# Patient Record
Sex: Male | Born: 1950 | Race: White | Hispanic: No | Marital: Married | Smoking: Never smoker
Health system: Southern US, Community
[De-identification: ages and names within clinical notes are randomized; demographics above are authoritative.]

## PROBLEM LIST (undated history)

## (undated) DIAGNOSIS — I10 Essential (primary) hypertension: Secondary | ICD-10-CM

## (undated) DIAGNOSIS — E119 Type 2 diabetes mellitus without complications: Secondary | ICD-10-CM

## (undated) DIAGNOSIS — H9120 Sudden idiopathic hearing loss, unspecified ear: Secondary | ICD-10-CM

## (undated) DIAGNOSIS — Z952 Presence of prosthetic heart valve: Secondary | ICD-10-CM

## (undated) DIAGNOSIS — G20 Parkinson's disease: Secondary | ICD-10-CM

## (undated) DIAGNOSIS — G20A1 Parkinson's disease without dyskinesia, without mention of fluctuations: Secondary | ICD-10-CM

## (undated) DIAGNOSIS — D329 Benign neoplasm of meninges, unspecified: Secondary | ICD-10-CM

## (undated) HISTORY — DX: Presence of prosthetic heart valve: Z95.2

## (undated) HISTORY — DX: Type 2 diabetes mellitus without complications: E11.9

## (undated) HISTORY — PX: CATARACT EXTRACTION, BILATERAL: SHX1313

## (undated) HISTORY — PX: BACK SURGERY: SHX140

## (undated) HISTORY — PX: CARDIAC SURGERY: SHX584

## (undated) HISTORY — DX: Essential (primary) hypertension: I10

## (undated) HISTORY — PX: HEMORROIDECTOMY: SUR656

## (undated) HISTORY — PX: KNEE SURGERY: SHX244

---

## 1995-12-14 DIAGNOSIS — E119 Type 2 diabetes mellitus without complications: Secondary | ICD-10-CM

## 1995-12-14 HISTORY — DX: Type 2 diabetes mellitus without complications: E11.9

## 2016-04-20 ENCOUNTER — Encounter: Payer: Self-pay | Admitting: Endocrinology

## 2016-04-20 ENCOUNTER — Ambulatory Visit (INDEPENDENT_AMBULATORY_CARE_PROVIDER_SITE_OTHER): Payer: BLUE CROSS/BLUE SHIELD | Admitting: Endocrinology

## 2016-04-20 VITALS — BP 136/70 | HR 62 | Temp 97.9°F | Ht 70.0 in | Wt 235.0 lb

## 2016-04-20 DIAGNOSIS — E1159 Type 2 diabetes mellitus with other circulatory complications: Secondary | ICD-10-CM | POA: Diagnosis not present

## 2016-04-20 DIAGNOSIS — Z794 Long term (current) use of insulin: Secondary | ICD-10-CM

## 2016-04-20 DIAGNOSIS — I10 Essential (primary) hypertension: Secondary | ICD-10-CM

## 2016-04-20 DIAGNOSIS — Z954 Presence of other heart-valve replacement: Secondary | ICD-10-CM | POA: Diagnosis not present

## 2016-04-20 DIAGNOSIS — E119 Type 2 diabetes mellitus without complications: Secondary | ICD-10-CM | POA: Insufficient documentation

## 2016-04-20 DIAGNOSIS — Z952 Presence of prosthetic heart valve: Secondary | ICD-10-CM | POA: Insufficient documentation

## 2016-04-20 NOTE — Patient Instructions (Addendum)
good diet and exercise significantly improve the control of your diabetes.  please let me know if you wish to be referred to a dietician.  high blood sugar is very risky to your health.  you should see an eye doctor and dentist every year.  It is very important to get all recommended vaccinations.  controlling your blood pressure and cholesterol drastically reduces the damage diabetes does to your body.  Those who smoke should quit.  please discuss these with your doctor.  check your blood sugar twice a day.  vary the time of day when you check, between before the 3 meals, and at bedtime.  also check if you have symptoms of your blood sugar being too high or too low.  please keep a record of the readings and bring it to your next appointment here (or you can bring the meter itself).  You can write it on any piece of paper.  please call us sooner if your blood sugar goes below 70, or if you have a lot of readings over 200.  Please take these pump settings:  basal rate of 1.5 units/hr, 24 hrs per day.   mealtime bolus of 6 units with each meal.   correction bolus (which some people call "sensitivity," or "insulin sensitivity ratio," or just "isr") of 1 unit for each 25 by which your glucose exceeds 100.  At our office, we are fortunate to have two specialists who are happy to help you:  Leonia Reader, RN, CDE, is a diabetes educator and pump trainer.  She is here on Monday mornings, and all day Tuesday and Wednesday.  She is can help you with low blood sugar avoidance and treatment, injecting insulin, sick day management, and others.   Antonieta Iba, RD is our dietician.  She is here all day Thursday and Friday.  She can advise you about a healthy diet.  She can also help you about a variety of special diabetes situations, such as shift work, Actor, gluten-free, diet for kidney patients, traveling with diabetes, and help for those who need to gain weight.   Please come back for a follow-up  appointment in 2-3 weeks.  Please see [name] the same day, to consider continuous glucose monitor.

## 2016-04-20 NOTE — Progress Notes (Signed)
Subjective:    Patient ID: Bradley Berger, male    DOB: May 10, 1951, 65 y.o.   MRN: [id number]  HPI pt states DM was dx'ed in 1997; he has moderate neuropathy of the lower extremities, and assoc pain; he also has CAD; he has been on insulin since 1999, and pump rx since 2000; he now uses a medtronic paradigm (current pump is between 80 and 57 years old; his diet and exercise are poor; he has never had pancreatitis, severe hypoglycemia or DKA.  continue basal rate of 2 units/hr, except for units/hr, 24 hrs per day. continue mealtime bolus of 8 units with each meal (but he only remembers to take only once per day) continue correction bolus (which some people call "sensitivity," or "insulin sensitivity ratio," or just "isr") of 1 unit for each 25 by which glucose exceeds 100.  He says cbg's vary from 60-200.  It is in general higher as the day goes on.   Past Medical History  Diagnosis Date  . S/P aortic valve replacement   . Diabetes ([place])     No past surgical history on file.  Social History   Social History  . Marital Status: Unknown    Spouse Name: N/A  . Number of Children: N/A  . Years of Education: N/A   Occupational History  . Not on file.   Social History Main Topics  . Smoking status: Never Smoker   . Smokeless tobacco: Not on file  . Alcohol Use: 0.0 oz/week    0 Standard drinks or equivalent per week  . Drug Use: Not on file  . Sexual Activity: Not on file   Other Topics Concern  . Not on file   Social History Narrative  . No narrative on file    No current outpatient prescriptions on file prior to visit.   No current facility-administered medications on file prior to visit.    Allergies  Allergen Reactions  . Hydromorphone Hcl Other (See Comments)    "flushing"  . Morphine Other (See Comments)    "flushing"    Family History  Problem Relation Age of Onset  . Diabetes Paternal Grandfather     BP 136/70 mmHg  Pulse 62  Temp(Src) 97.9 F (36.6 C)  (Oral)  Ht 5\' 10"  (1.778 m)  Wt 235 lb (106.595 kg)  BMI 33.72 kg/m2  SpO2 95%   Review of Systems denies blurry vision, headache, chest pain, sob, n/v, urinary frequency, excessive diaphoresis, depression, cold intolerance, and rhinorrhea.  He has chronic numbness and pain of the feet.  He has arthralgias, weight gain, easy bruising, and fatigue.      Objective:   Physical Exam VS: see vs page GEN: no distress HEAD: head: no deformity eyes: no periorbital swelling, no proptosis external nose and ears are normal mouth: no lesion seen NECK: supple, thyroid is not enlarged CHEST WALL: no deformity.  Old healed surgical scar (median sternotomy).  LUNGS: clear to auscultation CV: reg rate and rhythm, no murmur, but valve click is heard.  ABD: abdomen is soft, nontender.  no hepatosplenomegaly.  not distended.  no hernia MUSCULOSKELETAL: muscle bulk and strength are grossly normal.  no obvious joint swelling.  gait is normal and steady EXTEMITIES: no deformity.  no ulcer on the feet.  feet are of normal color and temp.  1+ bilat leg edema, and bilat varicosities.  There is ecchymosis under the right great toenail.  PULSES: dorsalis pedis intact bilat.  no carotid bruit. NEURO:  cn 2-12 grossly intact.   readily moves all 4's.  sensation is intact to touch on the feet, but decreased from normal SKIN:  Normal texture and temperature.  No rash or suspicious lesion is visible.   NODES:  None palpable at the neck PSYCH: alert, well-oriented.  Does not appear anxious nor depressed.    A1c=6.5%  I have reviewed outside records, and summarized: Pt was noted to have variable cbg's, and referred here.      Assessment & Plan:  Type 2 DM, on pump rx: The pattern of his cbg's indicates he needs some adjustment in his pump settings. For upcoming colonoscopy: The day before, night before, and day of colonoscopy, take a basal rate of just 1 unit/hr Do not take mealtime boluses Take usual  "correction" boluses. When you are eating after the procedure, resume usual settings   Patient is advised the following: Patient Instructions  good diet and exercise significantly improve the control of your diabetes.  please let me know if you wish to be referred to a dietician.  high blood sugar is very risky to your health.  you should see an eye doctor and dentist every year.  It is very important to get all recommended vaccinations.  controlling your blood pressure and cholesterol drastically reduces the damage diabetes does to your body.  Those who smoke should quit.  please discuss these with your doctor.  check your blood sugar twice a day.  vary the time of day when you check, between before the 3 meals, and at bedtime.  also check if you have symptoms of your blood sugar being too high or too low.  please keep a record of the readings and bring it to your next appointment here (or you can bring the meter itself).  You can write it on any piece of paper.  please call us sooner if your blood sugar goes below 70, or if you have a lot of readings over 200.  Please take these pump settings:  basal rate of 1.5 units/hr, 24 hrs per day.   mealtime bolus of 6 units with each meal.   correction bolus (which some people call "sensitivity," or "insulin sensitivity ratio," or just "isr") of 1 unit for each 25 by which your glucose exceeds 100.  At our office, we are fortunate to have two specialists who are happy to help you:  Leonia Reader, RN, CDE, is a diabetes educator and pump trainer.  She is here on Monday mornings, and all day Tuesday and Wednesday.  She is can help you with low blood sugar avoidance and treatment, injecting insulin, sick day management, and others.   Antonieta Iba, RD is our dietician.  She is here all day Thursday and Friday.  She can advise you about a healthy diet.  She can also help you about a variety of special diabetes situations, such as shift work, Actor,  gluten-free, diet for kidney patients, traveling with diabetes, and help for those who need to gain weight.   Please come back for a follow-up appointment in 2-3 weeks.  Please see [name] the same day, to consider continuous glucose monitor.

## 2016-04-22 ENCOUNTER — Telehealth: Payer: Self-pay | Admitting: Endocrinology

## 2016-04-22 ENCOUNTER — Encounter: Payer: Self-pay | Admitting: Endocrinology

## 2016-04-22 DIAGNOSIS — I10 Essential (primary) hypertension: Secondary | ICD-10-CM | POA: Insufficient documentation

## 2016-04-22 NOTE — Telephone Encounter (Signed)
please call patient: For upcoming colonoscopy: The day before, night before, and day of colonoscopy, take a basal rate of just 1 unit/hr Do not take mealtime boluses Take usual "correction" boluses. When you are eating after the procedure, resume usual settings.

## 2016-04-23 NOTE — Telephone Encounter (Signed)
I contacted the pt and advised of instructions below. Pt voiced understanding.

## 2016-05-04 ENCOUNTER — Encounter: Payer: Self-pay | Admitting: Endocrinology

## 2016-05-04 ENCOUNTER — Ambulatory Visit (INDEPENDENT_AMBULATORY_CARE_PROVIDER_SITE_OTHER): Payer: BLUE CROSS/BLUE SHIELD | Admitting: Endocrinology

## 2016-05-04 ENCOUNTER — Encounter: Payer: BLUE CROSS/BLUE SHIELD | Attending: Endocrinology | Admitting: Nutrition

## 2016-05-04 VITALS — BP 122/60 | HR 55 | Temp 98.0°F | Ht 70.0 in | Wt 240.0 lb

## 2016-05-04 DIAGNOSIS — Z794 Long term (current) use of insulin: Secondary | ICD-10-CM | POA: Diagnosis not present

## 2016-05-04 DIAGNOSIS — E1159 Type 2 diabetes mellitus with other circulatory complications: Secondary | ICD-10-CM

## 2016-05-04 NOTE — Patient Instructions (Addendum)
check your blood sugar twice a day.  vary the time of day when you check, between before the 3 meals, and at bedtime.  also check if you have symptoms of your blood sugar being too high or too low.  please keep a record of the readings and bring it to your next appointment here (or you can bring the meter itself).  You can write it on any piece of paper.  please call us sooner if your blood sugar goes below 70, or if you have a lot of readings over 200.  Please continue these pump settings for now:  basal rate of 1.5 units/hr, 24 hrs per day.   mealtime bolus of 6 units with each meal.   correction bolus (which some people call "sensitivity," or "insulin sensitivity ratio," or just "isr") of 1 unit for each 25 by which your glucose exceeds 100.  blood tests are requested for you today.  We'll let you know about the results. Please come back for a follow-up appointment in 3 months.  Please see [name] the same day, to consider continuous glucose monitor.

## 2016-05-04 NOTE — Progress Notes (Signed)
Subjective:    Patient ID: Bradley Berger, male    DOB: [birth year], 65 y.o.   MRN: [id number]  HPI  Pt returns for f/u of diabetes mellitus: DM type: Insulin-requiring type 2 Dx'ed: 0000000 Complications: polyneuropathy and CAD Therapy: insulin since 1999 DKA: never Severe hypoglycemia: never Pancreatitis: never Other: he uses a medtronic paradigm pump Interval history:  He takes these pump settings: basal rate of 1.5 units/hr, 24 hrs per day.   mealtime bolus of 6 units with each meal.   correction bolus of 1 unit for each 25 by which your glucose exceeds 100.  he brings a record of his cbg's which i have reviewed today.  It varies from 70-181.  It is in general higher as the day goes on.  pt states he feels well in general.  Past Medical History  Diagnosis Date  . S/P aortic valve replacement   . Diabetes ([place])   . HTN (hypertension)     No past surgical history on file.  Social History   Social History  . Marital Status: Unknown    Spouse Name: N/A  . Number of Children: N/A  . Years of Education: N/A   Occupational History  . Not on file.   Social History Main Topics  . Smoking status: Never Smoker   . Smokeless tobacco: Not on file  . Alcohol Use: 0.0 oz/week    0 Standard drinks or equivalent per week  . Drug Use: Not on file  . Sexual Activity: Not on file   Other Topics Concern  . Not on file   Social History Narrative    Current Outpatient Prescriptions on File Prior to Visit  Medication Sig Dispense Refill  . APIDRA 100 UNIT/ML injection     . aspirin EC 81 MG tablet Take by mouth.    . calcium-vitamin D (OSCAL WITH D) 500-200 MG-UNIT tablet Take by mouth.    . docusate sodium (COLACE) 100 MG capsule Take by mouth.    . gabapentin (NEURONTIN) 600 MG tablet Take by mouth.    Marland Kitchen imipramine (TOFRANIL) 10 MG tablet Take 10 mg by mouth at bedtime.    Marland Kitchen lisinopril (PRINIVIL,ZESTRIL) 2.5 MG tablet Take by mouth.    . loratadine (CLARITIN) 10 MG tablet  Take by mouth.    . metoprolol tartrate (LOPRESSOR) 25 MG tablet Take by mouth.    . ranitidine (ZANTAC) 150 MG tablet Take by mouth.    . Vitamins/Minerals TABS Take by mouth.    . warfarin (COUMADIN) 4 MG tablet Take by mouth.     No current facility-administered medications on file prior to visit.    Allergies  Allergen Reactions  . Hydromorphone Hcl Other (See Comments)    "flushing"  . Morphine Other (See Comments)    "flushing"    Family History  Problem Relation Age of Onset  . Diabetes Paternal Grandfather     BP 122/60 mmHg  Pulse 55  Temp(Src) 98 F (36.7 C) (Oral)  Ht 5\' 10"  (1.778 m)  Wt 240 lb (108.863 kg)  BMI 34.44 kg/m2  SpO2 95%  Review of Systems He denies hypoglycemia    Objective:   Physical Exam VITAL SIGNS:  See vs page GENERAL: no distress Pulses: dorsalis pedis intact bilat.   MSK: no deformity of the feet CV: no leg edema Skin:  no ulcer on the feet.  normal color and temp on the feet. Neuro: sensation is intact to touch on the feet  Fructosamine=269  Assessment & Plan:  DM: still overcontrolled.  Patient is advised the following: Patient Instructions  check your blood sugar twice a day.  vary the time of day when you check, between before the 3 meals, and at bedtime.  also check if you have symptoms of your blood sugar being too high or too low.  please keep a record of the readings and bring it to your next appointment here (or you can bring the meter itself).  You can write it on any piece of paper.  please call us sooner if your blood sugar goes below 70, or if you have a lot of readings over 200.  Please continue these pump settings for now:  basal rate of 1.5 units/hr, 24 hrs per day.   mealtime bolus of 6 units with each meal.   correction bolus (which some people call "sensitivity," or "insulin sensitivity ratio," or just "isr") of 1 unit for each 25 by which your glucose exceeds 100.  blood tests are requested for you today.   We'll let you know about the results. Please come back for a follow-up appointment in 3 months.  Please see [name] the same day, to consider continuous glucose monitor.    addendum: Please reduce the basal rate to 1.4 units/hr, 24 hrs per day.

## 2016-05-05 LAB — FRUCTOSAMINE: Fructosamine: 269 umol/L (ref 0–285)

## 2016-05-05 NOTE — Patient Instructions (Signed)
Call me when the pump ships, to set up an appt.for training.

## 2016-05-05 NOTE — Progress Notes (Signed)
Discussed CGM with patient, and the new insulin pump.  His Medtronic pump is past warrenty.  Showed him then new pump and discussed the advantages of being on the CGM.  He is not medicare and suggested he consider to get this before his wife retires in 1 year.  He filled out paperwork and we faxed in the paperwork to [place].   They had no final questions. He will call me when his pump is shipped to set up an appt.

## 2016-05-06 ENCOUNTER — Telehealth: Payer: Self-pay | Admitting: Endocrinology

## 2016-05-06 NOTE — Telephone Encounter (Signed)
I contacted the Bradley Berger and advised Per Dr. Loanne Drilling to reduce his basal rate to 1.4 units per day. Bradley Berger voiced understanding.

## 2016-05-06 NOTE — Telephone Encounter (Signed)
Patient is returning your call.  

## 2016-05-27 ENCOUNTER — Encounter: Payer: Self-pay | Admitting: Dietician

## 2016-05-27 ENCOUNTER — Encounter: Payer: BLUE CROSS/BLUE SHIELD | Attending: Endocrinology | Admitting: Dietician

## 2016-05-27 VITALS — Ht 70.0 in | Wt 238.0 lb

## 2016-05-27 DIAGNOSIS — Z794 Long term (current) use of insulin: Secondary | ICD-10-CM | POA: Insufficient documentation

## 2016-05-27 DIAGNOSIS — E1159 Type 2 diabetes mellitus with other circulatory complications: Secondary | ICD-10-CM | POA: Insufficient documentation

## 2016-05-27 DIAGNOSIS — E1359 Other specified diabetes mellitus with other circulatory complications: Secondary | ICD-10-CM

## 2016-05-27 NOTE — Patient Instructions (Addendum)
Bring the other meter downstairs.   Remember to check your blood sugar before meals and take the meal time insulin dose. Remember the "rule of 15" to treat low blood sugar. Consider getting glucose tabs. Choose an unsweetened cereal.  Add protein with each meal and snack. Stay as active as possible. Know that when you start to decrease your carbohydrate intake you may need to decrease your insulin.  Speak with Dr. Loanne Drilling or [name] about this. Be mindful about your carbohydrate intake and balance throughout the day.  Aim for 3-4 Carb Choices per meal (45-60 grams) +/- 1 either way  Aim for 0-1 Carbs per snack if hungry

## 2016-05-27 NOTE — Progress Notes (Signed)
Medical Nutrition Therapy:  Appt start time: 1100 end time:  1215.   Assessment:  Primary concerns today: Patient is here with his wife.  He would like to learn about healthy eating for diabetes and weight control.  He has had diabetes since 1997 and is now on an insulin pump.  Other hx also includes hx of hepatitis C, HTN, and AVR and CABG x 1 in 2013.  Fructosamine 05/03/16  269.  He checks his blood sugar bid.  In the am it is 65-100 and before bed.  He often forgets to check his blood sugar and take his insulin with Lunch and Dinner.  He is to move another meter downstairs so he will remember to check his blood sugars prior to other meals.  Patient lives with his wife and 47 yo daughter who is sick with a liver problem. She does the shopping and cooking.  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     Preferred Learning Style:   No preference indicated   Learning Readiness:   Ready  MEDICATIONS: see list to include coumadin, Apidra via the pump with 1.4 units/ hour bolus rate pre patient, 6 units every meal for CBG>100 and 2 additional units for every 25 points greater than 100.   DIETARY INTAKE:  24-hr recall:  B ( AM): honey nuts of oats cereal (2-3 servings) and 1% milk sometimes with berries, 1/2-3/4 cup OJ Snk ( AM): NABS or Nekot peanut butter cookie  L ( PM): leftovers OR ham and cheese sandwich on white wheat bread or pimento cheese or chicken salad, chips Snk ( PM): occasional like NABS or Nekot D (6 PM): chicken nuggets, green beans, mack and cheese, 1/2 apple OR chicken, starch, vegetables, bread Snk ( PM): regular fudge sickle occasionally Beverages: water, juice, 1/2 cup sugar in 1 gallon tea (24 ounces per day)  Usual physical activity: walks the dogs for 30 minutes twice per day but slow and with a lot of steps, does his own yard work.  Estimated energy needs: 2000 calories 225 g carbohydrates 125 g protein 67 g  fat  Progress Towards Goal(s):  In progress.   Nutritional Diagnosis:  NB-1.1 Food and nutrition-related knowledge deficit As related to balance of carbohydrate, protein, and fat as well as carbohydrate counting.  As evidenced by diet hx and patient report.    Intervention:  Nutrition counseling/education related to carbohydrate counting and basics of diabetic diet.  Showed carbohydrates by portion size.  Encouraged increased activity and discussed the benefits. . Bring the other meter downstairs.   Remember to check your blood sugar before meals and take the meal time insulin dose. Remember the "rule of 15" to treat low blood sugar. Consider getting glucose tabs. Choose an unsweetened cereal.  Add protein with each meal and snack. Stay as active as possible. Know that when you start to decrease your carbohydrate intake you may need to decrease your insulin.  Speak with Dr. Loanne Drilling or [name] about this. Be mindful about your carbohydrate intake and balance throughout the day.  Aim for 3-4 Carb Choices per meal (45-60 grams) +/- 1 either way  Aim for 0-1 Carbs per snack if hungry   Teaching Method Utilized:  Visual Auditory Hands on  Handouts given during visit include: Living Well with Diabetes Carb Counting and Food Label handouts Meal Plan Card Label reading  Snack list  Barriers to learning/adherence to lifestyle change: none  Demonstrated  degree of understanding via:  Teach Back   Monitoring/Evaluation:  Dietary intake, exercise, and body weight prn.

## 2016-06-01 ENCOUNTER — Telehealth: Payer: Self-pay | Admitting: Nutrition

## 2016-06-01 NOTE — Telephone Encounter (Signed)
Patient is wanting to upgrade his pump and had questions.  He was told to contact [employer] ASAP to upgrade to new pump.  He is wanting to do this before he goes on medicare.  He has 2 weeks.

## 2016-07-06 ENCOUNTER — Ambulatory Visit (INDEPENDENT_AMBULATORY_CARE_PROVIDER_SITE_OTHER): Payer: BLUE CROSS/BLUE SHIELD | Admitting: Neurology

## 2016-07-06 ENCOUNTER — Encounter: Payer: Self-pay | Admitting: Neurology

## 2016-07-06 VITALS — BP 132/70 | HR 56 | Ht 66.0 in | Wt 227.0 lb

## 2016-07-06 DIAGNOSIS — R251 Tremor, unspecified: Secondary | ICD-10-CM

## 2016-07-06 DIAGNOSIS — M21372 Foot drop, left foot: Secondary | ICD-10-CM

## 2016-07-06 DIAGNOSIS — E1142 Type 2 diabetes mellitus with diabetic polyneuropathy: Secondary | ICD-10-CM

## 2016-07-06 NOTE — Progress Notes (Signed)
Subjective:   Bradley Berger was seen in consultation in the movement disorder clinic at the request of Yvone Neu, MD.  The evaluation is for tremor.  The patient has previously seen a neurologist but I don't have any of those records.  I did review records from his PCP.    This patient is accompanied in the office by his spouse who supplements the history.   The patient is a 65 y.o. right handed male with a history of tremor.  Tremor has been present for 1-1.5 years and is located in both hands, but the right seems worse.  It is intermittent.  It is worse when BS is low.  He saw the neurologist in [place] neurologist about a year ago and was started on primidone and he took that for about 3 months.  He was told he needed to d/c that when he started the [facility] and he is now disease free.  He is on gabapentin 600 mg but that is for neuropathy and takes that q hs.  There is no family hx of tremor.  Pt admits that he had a friend who came here who was previously dx with "tremor" and ultimately had PD.    Affected by caffeine:  No. (doesn't drink coffee) Affected by alcohol:  unknown (only drinks 12 pack beer per year) Affected by stress:  Yes.   Affected by fatigue:  Yes.   Spills soup if on spoon:  no Spills glass of liquid if full:  No. Affects ADL's (tying shoes, brushing teeth, etc):  No.  Current/Previously tried tremor medications: primidone (taken off due to starting harvoni for hep c)  Current medications that may exacerbate tremor:  n/a  Outside reports reviewed: historical medical records and office notes.  Allergies  Allergen Reactions  . Dilaudid [Hydromorphone Hcl] Other (See Comments)    "flushing"  . Morphine Other (See Comments)    "flushing"    Outpatient Encounter Prescriptions as of 07/06/2016  Medication Sig  . APIDRA 100 UNIT/ML injection   . aspirin EC 81 MG tablet Take by mouth.  . calcium-vitamin D (OSCAL WITH D) 500-200 MG-UNIT tablet Take  by mouth.  . docusate sodium (COLACE) 100 MG capsule Take by mouth.  . esomeprazole (NEXIUM) 20 MG capsule Take 20 mg by mouth daily at 12 noon.  . gabapentin (NEURONTIN) 600 MG tablet Take by mouth.  Marland Kitchen imipramine (TOFRANIL) 10 MG tablet Take 10 mg by mouth at bedtime. Reported on 05/27/2016  . Insulin Infusion Pump Supplies (MINIMED INFUSION SET-MMT 397) MISC 1 Device by Does not apply route every 3 (three) days.  . Insulin Infusion Pump Supplies (MINIMED RESERVOIR 3ML) MISC 1 Device by Does not apply route every 3 (three) days.  Marland Kitchen lisinopril (PRINIVIL,ZESTRIL) 2.5 MG tablet Take by mouth.  . loratadine (CLARITIN) 10 MG tablet Take by mouth.  . metoprolol tartrate (LOPRESSOR) 25 MG tablet Take by mouth.  . ranitidine (ZANTAC) 150 MG tablet Take by mouth.  . traZODone (DESYREL) 50 MG tablet Take 50 mg by mouth at bedtime. Unknown dose  . Vitamins/Minerals TABS Take by mouth.  . warfarin (COUMADIN) 4 MG tablet Take by mouth.   No facility-administered encounter medications on file as of 07/06/2016.     Past Medical History:  Diagnosis Date  . Diabetes ([place])   . HTN (hypertension)   . S/P aortic valve replacement     No past surgical history on file.  Social History   Social History  . Marital  status: Unknown    Spouse name: N/A  . Number of children: N/A  . Years of education: N/A   Occupational History  . Not on file.   Social History Main Topics  . Smoking status: Never Smoker  . Smokeless tobacco: Not on file  . Alcohol use 0.0 oz/week  . Drug use: Unknown  . Sexual activity: Not on file   Other Topics Concern  . Not on file   Social History Narrative  . No narrative on file    No family status information on file.    Review of Systems A complete 10 system ROS was obtained and was negative apart from what is mentioned.   Objective:   VITALS:  There were no vitals filed for this visit. Gen:  Appears stated age and in NAD. HEENT:  Normocephalic, atraumatic.  The mucous membranes are moist. The superficial temporal arteries are without ropiness or tenderness. Cardiovascular: Regular rate and rhythm. Lungs: Clear to auscultation bilaterally. Neck: There are no carotid bruits noted bilaterally.  NEUROLOGICAL:  Orientation:  The patient is alert and oriented x 3.  Recent and remote memory are intact.  Attention span and concentration are normal.  Able to name objects and repeat without trouble.  Fund of knowledge is appropriate Cranial nerves: There is good facial symmetry. The pupils are equal round and reactive to light bilaterally. Fundoscopic exam reveals clear disc margins bilaterally. Extraocular muscles are intact and visual fields are full to confrontational testing. Speech is fluent and clear. Soft palate rises symmetrically and there is no tongue deviation. Hearing is intact to conversational tone. Tone: Tone is good throughout. Sensation: Sensation is intact to light touch and pinprick throughout (facial, trunk, extremities). Vibration is decreased at the bilateral big toe. There is no extinction with double simultaneous stimulation. There is no sensory dermatomal level identified. Coordination:  The patient has slow foot taps on the L but other RAMs were good.  However, he mocked RAMs with his jaw when performing with his hands Motor: Strength is 5/5 in the bilateral upper and lower extremities. There is L foot drop.   Shoulder shrug is equal bilaterally.  There is no pronator drift.  There are no fasciculations noted. DTR's: Deep tendon reflexes are 2-/4 at the bilateral biceps, triceps, brachioradialis, patella and trace at the bilateral achilles.  Plantar responses are downgoing bilaterally. Gait and Station: The patient is able to ambulate without difficulty. He is wide based.  He has L foot drop.  He has mild trouble ambulating in a tandem fashion.  He is able to walk on his heels.  He is able to stand in the romberg position.     MOVEMENT  EXAM: Tremor:  There is minimal tremor in the UE in the wing beating position.  The patient is able to draw Archimedes spirals without significant difficulty.  There is a tremor at rest in both thumbs.  The patient is able to pour water from one glass to another without spilling it.  LABS  TSH done 03/24/16 2.83     Assessment/Plan:   1.  Tremor  -pt has a bilateral resting tremor in both thumbs but he otherwise meets no criteria for idiopathic PD.  I would like to monitor him, however, to make sure that he will not meet diagnostic criteria with time.  Talked about s/s of PD and if he should develop any new concerning s/s should call before next visit.  2.  L foot drop  -probably due  to L5-S1 radiculopathy and he is s/p surgical intervention and no longer feels pain.  Didn't notice the food drop.    3.  Diabetic peripheral neuropathy  -talked about safety.  Is the likely etiology for balance issues.    4.  Follow up is anticipated in the next 6-8 months, sooner should new neurologic issues arise.  Much greater than 50% of this visit was spent in counseling and coordinating care.  Total face to face time:  60 min

## 2016-07-28 ENCOUNTER — Encounter: Payer: Self-pay | Admitting: Endocrinology

## 2016-07-28 ENCOUNTER — Ambulatory Visit (INDEPENDENT_AMBULATORY_CARE_PROVIDER_SITE_OTHER): Payer: BLUE CROSS/BLUE SHIELD | Admitting: Endocrinology

## 2016-07-28 ENCOUNTER — Encounter: Payer: BLUE CROSS/BLUE SHIELD | Attending: Endocrinology | Admitting: Nutrition

## 2016-07-28 VITALS — BP 116/70 | HR 58 | Temp 98.3°F | Ht 66.0 in | Wt 234.4 lb

## 2016-07-28 DIAGNOSIS — E1159 Type 2 diabetes mellitus with other circulatory complications: Secondary | ICD-10-CM | POA: Diagnosis not present

## 2016-07-28 DIAGNOSIS — Z713 Dietary counseling and surveillance: Secondary | ICD-10-CM | POA: Diagnosis present

## 2016-07-28 DIAGNOSIS — E119 Type 2 diabetes mellitus without complications: Secondary | ICD-10-CM | POA: Diagnosis not present

## 2016-07-28 DIAGNOSIS — Z794 Long term (current) use of insulin: Secondary | ICD-10-CM

## 2016-07-28 DIAGNOSIS — E0800 Diabetes mellitus due to underlying condition with hyperosmolarity without nonketotic hyperglycemic-hyperosmolar coma (NKHHC): Secondary | ICD-10-CM

## 2016-07-28 LAB — POCT GLYCOSYLATED HEMOGLOBIN (HGB A1C): Hemoglobin A1C: 6.4

## 2016-07-28 NOTE — Patient Instructions (Addendum)
check your blood sugar twice a day.  vary the time of day when you check, between before the 3 meals, and at bedtime.  also check if you have symptoms of your blood sugar being too high or too low.  please keep a record of the readings and bring it to your next appointment here (or you can bring the meter itself).  You can write it on any piece of paper.  please call us sooner if your blood sugar goes below 70, or if you have a lot of readings over 200.  Please take these pump settings for now:  basal rate of 1.3 units/hr, 24 hrs per day.   mealtime bolus of 6 units with each meal.   correction bolus (which some people call "sensitivity," or "insulin sensitivity ratio," or just "isr") of 1 unit for each 25 by which your glucose exceeds 100.  Try putting athlete's foot cream on your feet.   Please come back for a follow-up appointment in 3 months.

## 2016-07-28 NOTE — Patient Instructions (Signed)
Call if interested in getting the Dexcom sensor.

## 2016-07-28 NOTE — Progress Notes (Signed)
Subjective:    Patient ID: Bradley Berger, male    DOB: 28-Oct-1951, 65 y.o.   MRN: [id number]  HPI Pt returns for f/u of diabetes mellitus: DM type: Insulin-requiring type 2 Dx'ed: 0000000 Complications: polyneuropathy and CAD Therapy: insulin since 1999 DKA: never Severe hypoglycemia: never Pancreatitis: never Other: he uses a medtronic paradigm pump.   Interval history:  He takes these pump settings:  basal rate of 1.5 units/hr, 24 hrs per day.   mealtime bolus of 6 units with each meal.   correction bolus of 1 unit for each 25 by which your glucose exceeds 100.   no cbg record, but states cbg's are vary from 62-100.  It is in general higher as the day goes on.  pt states he feels well in general.   Past Medical History:  Diagnosis Date  . Diabetes ([place]) 1997   Last A1C 6.2  . HTN (hypertension)   . S/P aortic valve replacement     Past Surgical History:  Procedure Laterality Date  . BACK SURGERY    . CARDIAC SURGERY    . CATARACT EXTRACTION, BILATERAL    . HEMORROIDECTOMY    . KNEE SURGERY Left     Social History   Social History  . Marital status: Unknown    Spouse name: N/A  . Number of children: N/A  . Years of education: N/A   Occupational History  . Not on file.   Social History Main Topics  . Smoking status: Never Smoker  . Smokeless tobacco: Not on file  . Alcohol use 0.0 oz/week     Comment: very rare  . Drug use: No  . Sexual activity: Not on file   Other Topics Concern  . Not on file   Social History Narrative  . No narrative on file    Current Outpatient Prescriptions on File Prior to Visit  Medication Sig Dispense Refill  . APIDRA 100 UNIT/ML injection     . aspirin EC 81 MG tablet Take by mouth.    . docusate sodium (COLACE) 100 MG capsule Take by mouth.    . gabapentin (NEURONTIN) 600 MG tablet Take by mouth.    . Insulin Infusion Pump Supplies (MINIMED INFUSION SET-MMT 397) MISC 1 Device by Does not apply route every 3 (three)  days.    . Insulin Infusion Pump Supplies (MINIMED RESERVOIR 3ML) MISC 1 Device by Does not apply route every 3 (three) days.    Marland Kitchen lisinopril (PRINIVIL,ZESTRIL) 2.5 MG tablet Take by mouth.    . loratadine (CLARITIN) 10 MG tablet Take by mouth.    . metoprolol tartrate (LOPRESSOR) 25 MG tablet Take by mouth 2 (two) times daily.     . Multiple Vitamin (MULTIVITAMIN) tablet Take 1 tablet by mouth daily.    . ranitidine (ZANTAC) 150 MG tablet Take by mouth.    . traZODone (DESYREL) 50 MG tablet Take 50 mg by mouth at bedtime. Unknown dose    . warfarin (COUMADIN) 4 MG tablet Take by mouth.     No current facility-administered medications on file prior to visit.     Allergies  Allergen Reactions  . Dilaudid [Hydromorphone Hcl] Other (See Comments)    "flushing"  . Morphine Other (See Comments)    "flushing"    Family History  Problem Relation Age of Onset  . Lung cancer Mother     BP 116/70 (BP Location: Left Arm, Patient Position: Sitting, Cuff Size: Large)   Pulse (!) 58  Temp 98.3 F (36.8 C) (Oral)   Ht 5\' 6"  (1.676 m)   Wt 234 lb 6.4 oz (106.3 kg)   SpO2 97%   BMI 37.83 kg/m    Review of Systems Denies LOC    Objective:   Physical Exam VITAL SIGNS:  See vs page GENERAL: no distress Pulses: dorsalis pedis intact bilat.   MSK: no deformity of the feet CV: no leg edema Skin:  no ulcer on the feet.  normal color and temp on the feet.  Mild rash on the plantar aspects of the feet.   Neuro: sensation is intact to touch on the feet   Lab Results  Component Value Date   HGBA1C 6.4 07/28/2016      Assessment & Plan:  Type 2 DM: slightly overcontrolled Rash, new, prob tinea pedis.

## 2016-07-28 NOTE — Assessment & Plan Note (Signed)
Patient has a Paradigm 550 and is interested in getting a new pump.  He is [employer] and they are not covering the new pump.  He likes his Medtronic, and preferrs to wait until his current pump stopps working.  He is afraid if he upgrades, and next year [employer] pays, he will not be able to get the new model.   He sometimes has lows during the night.  Dr. Loanne Drilling decreased his basal rate.  He needed some assistance with this.  His basal rate was changed from 1.4 to 1.3u/hr.    Because of the lows, he was shown the Dexcom sensor.  He prefers to wait until it is all combined with his pump.  I told him that he will be put on a list, and if I hear that his insurance will pay for the 670G, that I will call him.   He was given the brochure for the [facility] and will call me if he is interested in getting this.

## 2016-09-16 DIAGNOSIS — R42 Dizziness and giddiness: Secondary | ICD-10-CM | POA: Diagnosis not present

## 2016-09-16 DIAGNOSIS — Z886 Allergy status to analgesic agent status: Secondary | ICD-10-CM | POA: Diagnosis not present

## 2016-09-16 DIAGNOSIS — R55 Syncope and collapse: Secondary | ICD-10-CM | POA: Diagnosis not present

## 2016-09-23 ENCOUNTER — Telehealth: Payer: Self-pay | Admitting: Neurology

## 2016-09-23 NOTE — Telephone Encounter (Signed)
Patient's wife made aware.   When he was evaluated in the ER she specifically asked about meningitis and they told her blood work was negative for this. He has not had any fevers.   They will see Primary Care and call if needed.

## 2016-09-23 NOTE — Telephone Encounter (Signed)
Patient's wife called with questions about follow up after patient's course over the last 3 weeks.   3 weeks ago patient was given new hearing aids. He was to follow up in a week for adjustment.  When he went to his visit for adjustment He felt like his ears needed to "pop". They stated he had decreased hearing on the left. 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 (Friday)  They waited the weekend to see if it would get better and it didn't. Monday morning he started feeling dizzy. He went to see ENT. 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   The next two days he started feeling dizzy and nauseated. He was prescribed medications to treat this through ENT (dr. Mickey Farber) but it did not help. On Wednesday evening he passed out. They took him to [facility]- he had a CT head which was normal. He was dehydrated and had some orthostatic hypotension but nothing else was found. They scheduled outpatient MR Brain and he left the hospital.   He developed stiffness in his neck and can not turn his head now without moving his shoulders. He is very unsteady on his feet and the only thing that makes this better is when he lays down. He had MR Brain which was also normal.   He had spinal surgery 15-16 years ago and after this had a spinal leak. It was never surgical repaired- they were told it sealed off on its own. He keeps telling his wife that this feels the same as when he had a spinal leak.   They have not seen any other providers, and are not sure where to start for care. Please advise.

## 2016-09-23 NOTE — Telephone Encounter (Signed)
The first place to start for this care is via PCP.  However, if neck stiff/fevers/confusion/dizziness, he may need to be eval via ER to make sure not infectious (meningitis)

## 2016-10-28 ENCOUNTER — Ambulatory Visit: Payer: BLUE CROSS/BLUE SHIELD | Admitting: Endocrinology

## 2016-11-07 NOTE — Progress Notes (Signed)
Subjective:    Patient ID: Bradley Berger, male    DOB: July 23, 1951, 65 y.o.   MRN: [id number]  HPI Pt returns for f/u of diabetes mellitus: DM type: Insulin-requiring type 2 Dx'ed: 0000000 Complications: polyneuropathy and CAD Therapy: insulin since 1999 DKA: never Severe hypoglycemia: never Pancreatitis: never Other: he uses a medtronic paradigm pump.   Interval history:  He takes these pump settings:  basal rate of 1.3 units/hr, 24 hrs per day.   mealtime bolus of 6 units with each meal.   correction bolus of 1 unit for each 25 by which glucose exceeds 100.   no cbg record, but states cbg's are vary from 90-100's.  It is in general higher as the day goes on.  He averages a total of approx 50 units/day.  He says he often misses the mealtime boluses.  He often checks cbg only 1-2 times per day.  Past Medical History:  Diagnosis Date  . Diabetes ([place]) 1997   Last A1C 6.2  . HTN (hypertension)   . S/P aortic valve replacement     Past Surgical History:  Procedure Laterality Date  . BACK SURGERY    . CARDIAC SURGERY    . CATARACT EXTRACTION, BILATERAL    . HEMORROIDECTOMY    . KNEE SURGERY Left     Social History   Social History  . Marital status: Unknown    Spouse name: N/A  . Number of children: N/A  . Years of education: N/A   Occupational History  . Not on file.   Social History Main Topics  . Smoking status: Never Smoker  . Smokeless tobacco: Not on file  . Alcohol use 0.0 oz/week     Comment: very rare  . Drug use: No  . Sexual activity: Not on file   Other Topics Concern  . Not on file   Social History Narrative  . No narrative on file    Current Outpatient Prescriptions on File Prior to Visit  Medication Sig Dispense Refill  . aspirin EC 81 MG tablet Take by mouth.    . Cholecalciferol (VITAMIN D3) 5000 units CAPS Take by mouth.    . docusate sodium (COLACE) 100 MG capsule Take by mouth.    . gabapentin (NEURONTIN) 600 MG tablet Take by mouth.     . Insulin Infusion Pump Supplies (MINIMED INFUSION SET-MMT 397) MISC 1 Device by Does not apply route every 3 (three) days.    . Insulin Infusion Pump Supplies (MINIMED RESERVOIR 3ML) MISC 1 Device by Does not apply route every 3 (three) days.    Marland Kitchen lisinopril (PRINIVIL,ZESTRIL) 2.5 MG tablet Take by mouth.    . loratadine (CLARITIN) 10 MG tablet Take by mouth.    . metoprolol tartrate (LOPRESSOR) 25 MG tablet Take by mouth 2 (two) times daily.     . Multiple Vitamin (MULTIVITAMIN) tablet Take 1 tablet by mouth daily.    . ranitidine (ZANTAC) 150 MG tablet Take by mouth.    . traZODone (DESYREL) 50 MG tablet Take 50 mg by mouth at bedtime. Unknown dose    . warfarin (COUMADIN) 4 MG tablet Take by mouth.     No current facility-administered medications on file prior to visit.     Allergies  Allergen Reactions  . Dilaudid [Hydromorphone Hcl] Other (See Comments)    "flushing"  . Morphine Other (See Comments)    "flushing"    Family History  Problem Relation Age of Onset  . Lung cancer Mother  BP 128/70   Pulse 62   Ht 5\' 6"  (1.676 m)   Wt 236 lb (107 kg)   SpO2 95%   BMI 38.09 kg/m   Review of Systems He denies hypoglycemia.      Objective:   Physical Exam VITAL SIGNS:  See vs page GENERAL: no distress Pulses: dorsalis pedis intact bilat.   MSK: no deformity of the feet CV: no leg edema Skin:  no ulcer on the feet.  normal color and temp on the feet.  Neuro: sensation is intact to touch on the feet.  Ext: There is bilateral onychomycosis of the toenails.   A1c=7.6%    Assessment & Plan:  Insulin-requiring type 2 DM, with CAD: worse Noncompliance with cbg recording and boluses, new.  In this setting, we cannot safely increase insulin.   Patient is advised the following: Patient Instructions  check your blood sugar 4 times a day: before the 3 meals, and at bedtime.  also check if you have symptoms of your blood sugar being too high or too low.  please keep a  record of the readings and bring it to your next appointment here (or you can bring the meter itself).  You can write it on any piece of paper.  please call us sooner if your blood sugar goes below 70, or if you have a lot of readings over 200.  Please continue these pump settings for now:  basal rate of 1.3 units/hr, 24 hrs per day.   mealtime bolus of 6 units with each meal.   correction bolus (which some people call "sensitivity," or "insulin sensitivity ratio," or just "isr") of 1 unit for each 25 by which your glucose exceeds 100.  Please come back for a follow-up appointment in 4 months.

## 2016-11-09 ENCOUNTER — Ambulatory Visit (INDEPENDENT_AMBULATORY_CARE_PROVIDER_SITE_OTHER): Payer: BLUE CROSS/BLUE SHIELD | Admitting: Endocrinology

## 2016-11-09 ENCOUNTER — Encounter: Payer: Self-pay | Admitting: Endocrinology

## 2016-11-09 VITALS — BP 128/70 | HR 62 | Ht 66.0 in | Wt 236.0 lb

## 2016-11-09 DIAGNOSIS — Z794 Long term (current) use of insulin: Secondary | ICD-10-CM | POA: Diagnosis not present

## 2016-11-09 DIAGNOSIS — E0800 Diabetes mellitus due to underlying condition with hyperosmolarity without nonketotic hyperglycemic-hyperosmolar coma (NKHHC): Secondary | ICD-10-CM | POA: Diagnosis not present

## 2016-11-09 LAB — POCT GLYCOSYLATED HEMOGLOBIN (HGB A1C): HEMOGLOBIN A1C: 7.6

## 2016-11-09 MED ORDER — APIDRA 100 UNIT/ML IJ SOLN: INTRAMUSCULAR | 3 refills | Status: DC

## 2016-11-09 NOTE — Patient Instructions (Addendum)
check your blood sugar 4 times a day: before the 3 meals, and at bedtime.  also check if you have symptoms of your blood sugar being too high or too low.  please keep a record of the readings and bring it to your next appointment here (or you can bring the meter itself).  You can write it on any piece of paper.  please call us sooner if your blood sugar goes below 70, or if you have a lot of readings over 200.  Please continue these pump settings for now:  basal rate of 1.3 units/hr, 24 hrs per day.   mealtime bolus of 6 units with each meal.   correction bolus (which some people call "sensitivity," or "insulin sensitivity ratio," or just "isr") of 1 unit for each 25 by which your glucose exceeds 100.  Please come back for a follow-up appointment in 4 months.

## 2016-11-23 ENCOUNTER — Telehealth: Payer: Self-pay | Admitting: Endocrinology

## 2016-11-23 NOTE — Telephone Encounter (Signed)
[facility] called and said that they need to hear from [place] regarding the insulin prescription. They need a call back today by 5:30 Ref# [id number]

## 2016-11-23 NOTE — Telephone Encounter (Signed)
humalog or novolog is ok--same dosage

## 2016-11-23 NOTE — Telephone Encounter (Signed)
I contacted [facility] and was advised the apidra is not covered under the patient's insurance formulary. Would you like to proceed with a PA?

## 2016-11-24 NOTE — Telephone Encounter (Signed)
I contacted the patient and requested a call back to discuss further alternatives.

## 2016-11-24 NOTE — Telephone Encounter (Signed)
I need to know the reason why the other 2 did not work

## 2016-11-24 NOTE — Telephone Encounter (Signed)
See message and advise on how to proceed.  Thanks!

## 2016-11-24 NOTE — Telephone Encounter (Signed)
Pt states the humalog and novolog has been tried in the past and he prefers apidra if PA can be obtained # [phone]

## 2016-11-25 NOTE — Telephone Encounter (Signed)
Ok, [place]

## 2016-11-25 NOTE — Telephone Encounter (Signed)
I contacted the patient. He stated he had tried the two medications years ago. He stated he has been on the apidra for 8 year and it has been working very well for him. Patient stated when he used the novolog and humalog the medications did not provide blood sugar control for him.

## 2016-11-25 NOTE — Telephone Encounter (Signed)
PA placed on MD's to review.

## 2016-11-29 ENCOUNTER — Telehealth: Payer: Self-pay | Admitting: Endocrinology

## 2016-11-29 NOTE — Telephone Encounter (Signed)
Pt's PA for Apidra has not come back with a determination yet. Can you please call in a rx request for [facility]alog [facility] thank you!

## 2016-11-29 NOTE — Telephone Encounter (Signed)
Please refill prn 

## 2016-11-30 MED ORDER — INSULIN LISPRO 100 UNIT/ML ~~LOC~~ SOLN: SUBCUTANEOUS | 2 refills | Status: DC

## 2016-11-30 NOTE — Telephone Encounter (Signed)
I contacted the patient and advised we have submitted the Rx for humalog. Patient had no further questions at this time.

## 2016-12-16 ENCOUNTER — Telehealth: Payer: Self-pay | Admitting: Endocrinology

## 2016-12-16 NOTE — Telephone Encounter (Signed)
See message, Could you advise during Dr. Cordelia Pen absence? Thanks!

## 2016-12-16 NOTE — Telephone Encounter (Signed)
He needs to use correction boluses if not eating. Also, he may consider using a low dose of Humalog every 4 hours (e.g. 2-3 units) if not eating at all if sugars are still high.

## 2016-12-16 NOTE — Telephone Encounter (Addendum)
I contacted the patient's wife and advised of message. Patient's wife voiced understanding and stated she would call back if the patient needed the Humalog.

## 2016-12-16 NOTE — Telephone Encounter (Signed)
Pt's wife called in and said that Pt has had a really bad stomach virus and that he hasn't had anything in his system since last night, but his sugars are about 350. He wants to know what he should do to get this under control.

## 2017-02-02 NOTE — Progress Notes (Addendum)
Subjective:   Bradley Berger was seen in consultation in the movement disorder clinic at the request of Yvone Neu, MD.  The evaluation is for tremor.  The patient has previously seen a neurologist but I don't have any of those records.  I did review records from his PCP.    This patient is accompanied in the office by his spouse who supplements the history.   The patient is a 66 y.o. right handed male with a history of tremor.  Tremor has been present for 1-1.5 years and is located in both hands, but the right seems worse.  It is intermittent.  It is worse when BS is low.  He saw the neurologist in [place] neurologist about a year ago and was started on primidone and he took that for about 3 months.  He was told he needed to d/c that when he started the [facility] and he is now disease free.  He is on gabapentin 600 mg but that is for neuropathy and takes that q hs.  There is no family hx of tremor.  Pt admits that he had a friend who came here who was previously dx with "tremor" and ultimately had PD.    Affected by caffeine:  No. (doesn't drink coffee) Affected by alcohol:  unknown (only drinks 12 pack beer per year) Affected by stress:  Yes.   Affected by fatigue:  Yes.   Spills soup if on spoon:  no Spills glass of liquid if full:  No. Affects ADL's (tying shoes, brushing teeth, etc):  No.  Current/Previously tried tremor medications: primidone (taken off due to starting harvoni for hep c)  Current medications that may exacerbate tremor:  n/a  Outside reports reviewed: historical medical records and office notes.  02/07/17 update:  Patient follows up today, accompanied by his wife who supplements the history.  Patient has a history of mild resting tremor and I last saw him in July, 2017.  The patient states that it has stayed the same but wife states that it has gotten a little worse.  Most noticeable when holding a book or if tired.  He is R hand dominant and R hand may  shake more than the L.  No falls.  Not exercising to any significant degree.  Takes 30 min nap in afternoon.  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  No records are available.  With one of the vertigo epsiodes, he had a syncopal episode.   He went to the hospital and was dx with "a small brain tumor" per wife.  I was able to obtain this per wife and he was dx with a paraclinoid meningioma.  Hearing loss started to return and was uncomfortable as he heard loud "static" in the ear.  That is better but hearing regressed again.  Allergies  Allergen Reactions  . Dilaudid [Hydromorphone Hcl] Other (See Comments)    "flushing"  . Morphine Other (See Comments)    "flushing"    Outpatient Encounter Prescriptions as of 02/07/2017  Medication Sig  . aspirin EC 81 MG tablet Take by mouth.  . Cholecalciferol (VITAMIN D3) 5000 units CAPS Take by mouth.  . docusate sodium (COLACE) 100 MG capsule Take by mouth.  . gabapentin (NEURONTIN) 600 MG tablet Take by mouth.  . Insulin  Infusion Pump Supplies (MINIMED INFUSION SET-MMT 397) MISC 1 Device by Does not apply route every 3 (three) days.  . Insulin Infusion Pump Supplies (MINIMED RESERVOIR 3ML) MISC 1 Device by Does not apply route every 3 (three) days.  . insulin lispro (HUMALOG) 100 UNIT/ML injection Use 60 units max per day via insulin pump.  Marland Kitchen lisinopril (PRINIVIL,ZESTRIL) 2.5 MG tablet Take by mouth.  . loratadine (CLARITIN) 10 MG tablet Take by mouth.  . metoprolol tartrate (LOPRESSOR) 25 MG tablet Take by mouth 2 (two) times daily.   . Multiple Vitamin (MULTIVITAMIN) tablet Take 1 tablet by mouth daily.  . ranitidine (ZANTAC) 150 MG tablet Take by mouth.  . traZODone (DESYREL) 50 MG tablet Take 50 mg by mouth at bedtime. Unknown dose  . warfarin (COUMADIN) 4 MG tablet Take by mouth.  . [DISCONTINUED] APIDRA  100 UNIT/ML injection For use in pump, for a total of 60 units per day   No facility-administered encounter medications on file as of 02/07/2017.     Past Medical History:  Diagnosis Date  . Diabetes ([place]) 1997   Last A1C 6.2  . HTN (hypertension)   . S/P aortic valve replacement     Past Surgical History:  Procedure Laterality Date  . BACK SURGERY    . CARDIAC SURGERY    . CATARACT EXTRACTION, BILATERAL    . HEMORROIDECTOMY    . KNEE SURGERY Left     Social History   Social History  . Marital status: Unknown    Spouse name: N/A  . Number of children: N/A  . Years of education: N/A   Occupational History  . Not on file.   Social History Main Topics  . Smoking status: Never Smoker  . Smokeless tobacco: Never Used  . Alcohol use 0.0 oz/week     Comment: very rare  . Drug use: No  . Sexual activity: Not on file   Other Topics Concern  . Not on file   Social History Narrative  . No narrative on file    Family Status  Relation Status  . Mother Deceased  . Father Deceased   accident  . Sister Alive  . Sister Alive    Review of Systems A complete 10 system ROS was obtained and was negative apart from what is mentioned.   Objective:   VITALS:   Vitals:   02/07/17 0848  BP: 98/64  Pulse: 62  SpO2: 96%  Weight: 240 lb (108.9 kg)  Height: 5\' 10"  (1.778 m)   Gen:  Appears stated age and in NAD. HEENT:  Normocephalic, atraumatic. The mucous membranes are moist. The superficial temporal arteries are without ropiness or tenderness. Cardiovascular: Regular rate and rhythm. Lungs: Clear to auscultation bilaterally. Neck: There are no carotid bruits noted bilaterally.  NEUROLOGICAL:  Orientation:  The patient is alert and oriented x 3.  Recent and remote memory are intact.  Attention span and concentration are normal.  Able to name objects and repeat without trouble.  Fund of knowledge is appropriate Cranial nerves: There is good facial symmetry.   Extraocular muscles are intact and visual fields are full to confrontational testing. Speech is fluent and clear. Soft palate rises symmetrically and there is no tongue deviation. Hearing is intact to conversational tone. Tone: Tone is good throughout. Sensation: Sensation is intact to light touch in the UE Coordination:  The patient has some mild trouble with alternation of supination/pronation of the forearm on the L.    However, he mocked  RAMs with his jaw when performing with his hands Motor: Strength is 5/5 in the bilateral upper and lower extremities. There is L foot drop.   Shoulder shrug is equal bilaterally.  There is no pronator drift.  There are no fasciculations noted. Gait and Station: The patient is able to ambulate without difficulty. He is wide based.  He has L foot drop.    MOVEMENT EXAM: Tremor:  There is a rest tremor in the RUE.    LABS  TSH done 03/24/16 2.83  Lab Results  Component Value Date   HGBA1C 7.6 11/09/2016        Assessment/Plan:   1.  Tremor  -pt has a rest tremor but he otherwise meets no criteria for idiopathic PD.  I would like to monitor him, however, to make sure that he will not meet diagnostic criteria with time.  Talked about s/s of PD and if he should develop any new concerning s/s should call before next visit.  -talked about DaT scanning but probably wouldn't change any tx and his insurance wouldn't pay for it [facility]) so decided to hold for now  2.  L foot drop  -probably due to L5-S1 radiculopathy and he is s/p surgical intervention and no longer feels pain.    3.  Diabetic peripheral neuropathy  -talked about safety.  Is the likely etiology for balance issues.    4.  Paracliniod meningioma  -This was found when an MRI was done for hearing loss.  I told him that I would try to get a copy of his MRI films.  I told him we would likely just follow this and repeat an MRI, perhaps in a year. (addendum:  note received from Dr. Vertell Limber on 03/29/17  that they would just follow this)  -He is following with ENT.  Given that he had hearing loss, tinnitus and vertigo, I wonder if he did not have Mnire's disease.  I certainly will leave this treatment up to ENT.  He is planning on getting hearing aids in the next month.  5.  Much greater than 50% of this visit was spent in counseling and coordinating care.  Total face to face time:  30 min

## 2017-02-07 ENCOUNTER — Ambulatory Visit (INDEPENDENT_AMBULATORY_CARE_PROVIDER_SITE_OTHER): Payer: BLUE CROSS/BLUE SHIELD | Admitting: Neurology

## 2017-02-07 ENCOUNTER — Encounter: Payer: Self-pay | Admitting: Neurology

## 2017-02-07 VITALS — BP 98/64 | HR 62 | Ht 70.0 in | Wt 240.0 lb

## 2017-02-07 DIAGNOSIS — D329 Benign neoplasm of meninges, unspecified: Secondary | ICD-10-CM

## 2017-02-07 DIAGNOSIS — R251 Tremor, unspecified: Secondary | ICD-10-CM

## 2017-02-22 ENCOUNTER — Telehealth: Payer: Self-pay | Admitting: Neurology

## 2017-02-22 DIAGNOSIS — D329 Benign neoplasm of meninges, unspecified: Secondary | ICD-10-CM

## 2017-02-22 NOTE — Telephone Encounter (Signed)
Patient/wife made aware.  They are okay with having scan here instead of [place] where previous MR was done. Last MR in October.  Order placed for patient to have scan in May. [place] will call them directly to schedule.  They will call with any questions.

## 2017-02-22 NOTE — Telephone Encounter (Signed)
Received and reviewed pts MRI brain films.  20mm paraclinoid likely meningioma.  No mass effect.  Unless patient has new lateralizing weaknes/parasthesias/seizure will likely just repeat in 6 months to a year.  Please let pt know and place on our schedule to order MRI with/without

## 2017-03-06 ENCOUNTER — Ambulatory Visit
Admission: RE | Admit: 2017-03-06 | Discharge: 2017-03-06 | Disposition: A | Discharge status: Home / Self Care | Payer: BLUE CROSS/BLUE SHIELD | Source: Ambulatory Visit | Attending: Neurology | Admitting: Neurology

## 2017-03-06 DIAGNOSIS — D329 Benign neoplasm of meninges, unspecified: Secondary | ICD-10-CM

## 2017-03-06 MED ORDER — GADOBENATE DIMEGLUMINE 529 MG/ML IV SOLN
20.0000 mL | Freq: Once | INTRAVENOUS | Status: AC | PRN
  Administered 2017-03-06: 20 mL via INTRAVENOUS

## 2017-03-07 ENCOUNTER — Telehealth: Payer: Self-pay | Admitting: Neurology

## 2017-03-07 NOTE — Telephone Encounter (Signed)
-----   Message from Imogene, DO sent at 03/07/2017  7:52 AM EDT ----- Reviewed.  Agree.  Because of its encompassing of ICA, I think that I would like him to be seen by neurosx although I doubt they will do anything with it.  If agreeable, refer to Dr. Vertell Limber

## 2017-03-07 NOTE — Telephone Encounter (Signed)
Left message on machine for patient to call back.

## 2017-03-09 ENCOUNTER — Ambulatory Visit (INDEPENDENT_AMBULATORY_CARE_PROVIDER_SITE_OTHER): Payer: BLUE CROSS/BLUE SHIELD | Admitting: Endocrinology

## 2017-03-09 ENCOUNTER — Encounter: Payer: Self-pay | Admitting: Endocrinology

## 2017-03-09 VITALS — BP 122/62 | HR 63 | Ht 70.0 in | Wt 241.0 lb

## 2017-03-09 DIAGNOSIS — Z794 Long term (current) use of insulin: Secondary | ICD-10-CM

## 2017-03-09 DIAGNOSIS — E0800 Diabetes mellitus due to underlying condition with hyperosmolarity without nonketotic hyperglycemic-hyperosmolar coma (NKHHC): Secondary | ICD-10-CM

## 2017-03-09 MED ORDER — INSULIN GLULISINE 100 UNIT/ML IJ SOLN: INTRAMUSCULAR | 3 refills | Status: DC

## 2017-03-09 NOTE — Patient Instructions (Addendum)
check your blood sugar 4 times a day: before the 3 meals, and at bedtime.  also check if you have symptoms of your blood sugar being too high or too low.  please keep a record of the readings and bring it to your next appointment here (or you can bring the meter itself).  You can write it on any piece of paper.  please call us sooner if your blood sugar goes below 70, or if you have a lot of readings over 200.  Please continue these pump settings for now:  basal rate of 2.4 units/HR, 7 AM-10 PM, and 1.3 units/HR 10PM-7AM.   No mealtime bolus.  correction bolus (which some people call "sensitivity," or "insulin sensitivity ratio," or just "isr") of 1 unit for each 25 by which your glucose exceeds 100.  Please come back for a follow-up appointment in 2 months.

## 2017-03-09 NOTE — Telephone Encounter (Signed)
Patient came into the office and he was made aware of results. He is okay with neurosurgery opinion.  Referral faxed to [facility] at [phone] with confirmation received. They will contact the patient to schedule. Gave patient the contact number for them if he does not hear about an appt.

## 2017-03-09 NOTE — Progress Notes (Signed)
Subjective:    Patient ID: Bradley Berger, male    DOB: 10-04-1951, 66 y.o.   MRN: [id number]  HPI Pt returns for f/u of diabetes mellitus: DM type: Insulin-requiring type 2 Dx'ed: 9735 Complications: polyneuropathy and CAD Therapy: insulin since 1999 DKA: never Severe hypoglycemia: never Pancreatitis: never Other: he uses a medtronic paradigm pump.   Interval history:  He takes these pump settings:  basal rate of 1.3 units/hr, 24 hrs per day.   mealtime bolus of 6 units with each meal.   correction bolus of 1 unit for each 25 by which glucose exceeds 100.   no cbg record, but states cbg's are vary from 90-100's.  It is in general higher as the day goes on.  He averages a total of approx 45 units/day.  He says he still often misses the mealtime boluses.  He often checks cbg only 1-2 times per day.  Past Medical History:  Diagnosis Date  . Diabetes ([place]) 1997   Last A1C 6.2  . HTN (hypertension)   . S/P aortic valve replacement     Past Surgical History:  Procedure Laterality Date  . BACK SURGERY    . CARDIAC SURGERY    . CATARACT EXTRACTION, BILATERAL    . HEMORROIDECTOMY    . KNEE SURGERY Left     Social History   Social History  . Marital status: Unknown    Spouse name: N/A  . Number of children: N/A  . Years of education: N/A   Occupational History  . Not on file.   Social History Main Topics  . Smoking status: Never Smoker  . Smokeless tobacco: Never Used  . Alcohol use 0.0 oz/week     Comment: very rare  . Drug use: No  . Sexual activity: Not on file   Other Topics Concern  . Not on file   Social History Narrative  . No narrative on file    Current Outpatient Prescriptions on File Prior to Visit  Medication Sig Dispense Refill  . aspirin EC 81 MG tablet Take by mouth.    . Cholecalciferol (VITAMIN D3) 5000 units CAPS Take by mouth.    . docusate sodium (COLACE) 100 MG capsule Take by mouth.    . gabapentin (NEURONTIN) 600 MG tablet Take by  mouth.    . Insulin Infusion Pump Supplies (MINIMED INFUSION SET-MMT 397) MISC 1 Device by Does not apply route every 3 (three) days.    . Insulin Infusion Pump Supplies (MINIMED RESERVOIR 3ML) MISC 1 Device by Does not apply route every 3 (three) days.    Marland Kitchen lisinopril (PRINIVIL,ZESTRIL) 2.5 MG tablet Take by mouth.    . loratadine (CLARITIN) 10 MG tablet Take by mouth.    . metoprolol tartrate (LOPRESSOR) 25 MG tablet Take by mouth 2 (two) times daily.     . Multiple Vitamin (MULTIVITAMIN) tablet Take 1 tablet by mouth daily.    . ranitidine (ZANTAC) 150 MG tablet Take by mouth.    . warfarin (COUMADIN) 4 MG tablet Take by mouth.     No current facility-administered medications on file prior to visit.     Allergies  Allergen Reactions  . Dilaudid [Hydromorphone Hcl] Other (See Comments)    "flushing"  . Morphine Other (See Comments)    "flushing"    Family History  Problem Relation Age of Onset  . Lung cancer Mother     BP 122/62   Pulse 63   Ht 5\' 10"  (1.778 m)  Wt 241 lb (109.3 kg)   SpO2 96%   BMI 34.58 kg/m    Review of Systems He denies hypoglycemia     Objective:   Physical Exam VITAL SIGNS:  See vs page GENERAL: no distress Pulses: dorsalis pedis intact bilat.   MSK: no deformity of the feet CV: no leg edema.  Skin:  no ulcer on the feet.  normal color and temp on the feet.  Neuro: sensation is intact to touch on the feet.  Ext: There is bilateral onychomycosis of the toenails.   a1c=8.7%     Assessment & Plan:  Insulin-requiring type 2 DM, with PAD: worse.  Noncompliance with cbg recording and boluses. He needs a simpler pump regimen  Patient Instructions  check your blood sugar 4 times a day: before the 3 meals, and at bedtime.  also check if you have symptoms of your blood sugar being too high or too low.  please keep a record of the readings and bring it to your next appointment here (or you can bring the meter itself).  You can write it on any  piece of paper.  please call us sooner if your blood sugar goes below 70, or if you have a lot of readings over 200.  Please continue these pump settings for now:  basal rate of 2.4 units/HR, 7 AM-10 PM, and 1.3 units/HR 10PM-7AM.   No mealtime bolus.  correction bolus (which some people call "sensitivity," or "insulin sensitivity ratio," or just "isr") of 1 unit for each 25 by which your glucose exceeds 100.  Please come back for a follow-up appointment in 2 months.

## 2017-05-06 ENCOUNTER — Ambulatory Visit (INDEPENDENT_AMBULATORY_CARE_PROVIDER_SITE_OTHER): Payer: BLUE CROSS/BLUE SHIELD | Admitting: Endocrinology

## 2017-05-06 ENCOUNTER — Encounter: Payer: Self-pay | Admitting: Endocrinology

## 2017-05-06 VITALS — BP 132/64 | HR 64 | Ht 70.0 in | Wt 243.0 lb

## 2017-05-06 DIAGNOSIS — Z794 Long term (current) use of insulin: Secondary | ICD-10-CM

## 2017-05-06 DIAGNOSIS — E0800 Diabetes mellitus due to underlying condition with hyperosmolarity without nonketotic hyperglycemic-hyperosmolar coma (NKHHC): Secondary | ICD-10-CM | POA: Diagnosis not present

## 2017-05-06 LAB — POCT GLYCOSYLATED HEMOGLOBIN (HGB A1C): Hemoglobin A1C: 6.9

## 2017-05-06 NOTE — Progress Notes (Signed)
Subjective:    Patient ID: Bradley Berger, male    DOB: May 07, 1951, 66 y.o.   MRN: [id number]  HPI Pt returns for f/u of diabetes mellitus: DM type: Insulin-requiring type 2 Dx'ed: 6283 Complications: polyneuropathy and CAD Therapy: insulin since 1999 DKA: never Severe hypoglycemia: never Pancreatitis: never Other: he uses a medtronic paradigm pump; we stopped mealtime boluses, as pt was forgetting them.   Interval history:  He takes these pump settings:  basal rate of 1.3 units/hr, 24 hrs per day.   mealtime bolus of 6 units with each meal.   correction bolus of 1 unit for each 25 by which glucose exceeds 100.   no cbg record, but states cbg's are vary from 60-100's.  It is in general higher as the day goes on.  He has mild hypoglycemia approx twice per month.  This happens with activity.   Past Medical History:  Diagnosis Date  . Diabetes ([place]) 1997   Last A1C 6.2  . HTN (hypertension)   . S/P aortic valve replacement     Past Surgical History:  Procedure Laterality Date  . BACK SURGERY    . CARDIAC SURGERY    . CATARACT EXTRACTION, BILATERAL    . HEMORROIDECTOMY    . KNEE SURGERY Left     Social History   Social History  . Marital status: Unknown    Spouse name: N/A  . Number of children: N/A  . Years of education: N/A   Occupational History  . Not on file.   Social History Main Topics  . Smoking status: Never Smoker  . Smokeless tobacco: Never Used  . Alcohol use 0.0 oz/week     Comment: very rare  . Drug use: No  . Sexual activity: Not on file   Other Topics Concern  . Not on file   Social History Narrative  . No narrative on file    Current Outpatient Prescriptions on File Prior to Visit  Medication Sig Dispense Refill  . aspirin EC 81 MG tablet Take by mouth.    . Cholecalciferol (VITAMIN D3) 5000 units CAPS Take by mouth.    . gabapentin (NEURONTIN) 600 MG tablet Take by mouth.    . insulin glulisine (APIDRA) 100 UNIT/ML injection For use  in pump, for a total of 60 units/day 60 mL 3  . Insulin Infusion Pump Supplies (MINIMED INFUSION SET-MMT 397) MISC 1 Device by Does not apply route every 3 (three) days.    . Insulin Infusion Pump Supplies (MINIMED RESERVOIR 3ML) MISC 1 Device by Does not apply route every 3 (three) days.    Marland Kitchen lisinopril (PRINIVIL,ZESTRIL) 2.5 MG tablet Take by mouth.    . loratadine (CLARITIN) 10 MG tablet Take by mouth.    . metoprolol tartrate (LOPRESSOR) 25 MG tablet Take by mouth 2 (two) times daily.     . Multiple Vitamin (MULTIVITAMIN) tablet Take 1 tablet by mouth daily.    . ranitidine (ZANTAC) 150 MG tablet Take by mouth.    . warfarin (COUMADIN) 4 MG tablet Take by mouth.     No current facility-administered medications on file prior to visit.     Allergies  Allergen Reactions  . Dilaudid [Hydromorphone Hcl] Other (See Comments)    "flushing"  . Morphine Other (See Comments)    "flushing"    Family History  Problem Relation Age of Onset  . Lung cancer Mother     BP 132/64   Pulse 64   Ht 5\' 10"  (1.778  m)   Wt 243 lb (110.2 kg)   SpO2 96%   BMI 34.87 kg/m    Review of Systems Denies LOC    Objective:   Physical Exam VITAL SIGNS:  See vs page GENERAL: no distress Pulses: dorsalis pedis intact bilat.   MSK: no deformity of the feet CV: no leg edema.  Skin:  no ulcer on the feet.  normal color and temp on the feet.  Neuro: sensation is intact to touch on the feet.  Ext: There is bilateral onychomycosis of the toenails.    A1c=6.9%    Assessment & Plan:  Insulin-requiring type 2 DM, with DR: overcontrolled, given this pump regimen, without mealtime boluses Reduce insulin to:  basal rate of 2.4 units/HR, 7 AM-10 PM, and 1.2 units/HR 10PM-7AM.   No mealtime bolus.  correction bolus (which some people call "sensitivity," or "insulin sensitivity ratio," or just "isr") of 1 unit for each 25 by which your glucose exceeds 100.  For activity, suspend pump x 1-2 hrs

## 2017-05-06 NOTE — Patient Instructions (Addendum)
check your blood sugar 4 times a day: before the 3 meals, and at bedtime.  also check if you have symptoms of your blood sugar being too high or too low.  please keep a record of the readings and bring it to your next appointment here (or you can bring the meter itself).  You can write it on any piece of paper.  please call us sooner if your blood sugar goes below 70, or if you have a lot of readings over 200.  Please continue these pump settings for now:  basal rate of 2.4 units/HR, 7 AM-10 PM, and 1.2 units/HR 10PM-7AM.   No mealtime bolus.  correction bolus (which some people call "sensitivity," or "insulin sensitivity ratio," or just "isr") of 1 unit for each 25 by which your glucose exceeds 100.  For activity, suspend pump x 1-2 hrs. Please come back for a follow-up appointment in 4 months.

## 2017-08-08 ENCOUNTER — Ambulatory Visit: Payer: BLUE CROSS/BLUE SHIELD | Admitting: Neurology

## 2017-08-17 NOTE — Progress Notes (Signed)
Subjective:   Bradley Berger was seen in consultation in the movement disorder clinic at the request of Bradley Berger, Bradley Downer, MD.  The evaluation is for tremor.  The patient has previously seen a neurologist but I don't have any of those records.  I did review records from his PCP.    This patient is accompanied in the office by his spouse who supplements the history.   The patient is a 66 y.o. right handed male with a history of tremor.  Tremor has been present for 1-1.5 years and is located in both hands, but the right seems worse.  It is intermittent.  It is worse when BS is low.  He saw the neurologist in [facility] about a year ago and was started on primidone and he took that for about 3 months.  He was told he needed to d/c that when he started the [facility] and he is now disease free.  He is on gabapentin 600 mg but that is for neuropathy and takes that q hs.  There is no family hx of tremor.  Pt admits that he had a friend who came here who was previously dx with "tremor" and ultimately had PD.    Affected by caffeine:  No. (doesn't drink coffee) Affected by alcohol:  unknown (only drinks 12 pack beer per year) Affected by stress:  Yes.   Affected by fatigue:  Yes.   Spills soup if on spoon:  no Spills glass of liquid if full:  No. Affects ADL's (tying shoes, brushing teeth, etc):  No.  Current/Previously tried tremor medications: primidone (taken off due to starting Bradley Berger for hep c)  Current medications that may exacerbate tremor:  n/a  Outside reports reviewed: historical medical records and office notes.  02/07/17 update:  Patient follows up today, accompanied by his wife who supplements the history.  Patient has a history of mild resting tremor and I last saw him in July, 2017.  The patient states that it has stayed the same but wife states that it has gotten a little worse.  Most noticeable when holding a book or if tired.  He is R hand dominant and R hand may  shake more than the L.  No falls.  Not exercising to any significant degree.  Takes 30 min nap in afternoon.  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  No records are available.  With one of the vertigo epsiodes, he had a syncopal episode.   He went to the hospital and was dx with "a small brain tumor" per wife.  I was able to obtain this per wife and he was dx with a paraclinoid meningioma.  Hearing loss started to return and was uncomfortable as he heard loud "static" in the ear.  That is better but hearing regressed again.  08/18/17 update:  Patient seen today in follow-up for his tremor.  He is accompanied by his wife who supplements the history.  His tremor has been stable per patient but wife states its a little worse.  Pt will notice it when fatigued.  No falls.  He is doing water aerobics for exercise.  MRI was done in March, 2018 to evaluate his known meningioma.  It is in close proximity to the right ICA terminus.  Because of this,  he was sent for an evaluation with Dr. Vertell Berger.  I received a note from him dated 03/28/2017.  They're just going to follow this and repeat the scan in one year per his notes.  The patient states, however, that he has an appointment with him in October and thinks he has a repeat scan before that.  Allergies  Allergen Reactions  . Dilaudid [Hydromorphone Hcl] Other (See Comments)    "flushing"  . Morphine Other (See Comments)    "flushing"    Outpatient Encounter Prescriptions as of 08/18/2017  Medication Sig  . aspirin EC 81 MG tablet Take by mouth.  . Cholecalciferol (VITAMIN D3) 5000 units CAPS Take by mouth.  . famotidine (PEPCID) 10 MG tablet Take 10 mg by mouth 2 (two) times daily.  Marland Kitchen gabapentin (NEURONTIN) 600 MG tablet Take by mouth.  . insulin glulisine (APIDRA) 100 UNIT/ML injection For use in pump, for a total of 60  units/day  . Insulin Infusion Pump Supplies (MINIMED INFUSION SET-MMT 397) MISC 1 Device by Does not apply route every 3 (three) days.  . Insulin Infusion Pump Supplies (MINIMED RESERVOIR 3ML) MISC 1 Device by Does not apply route every 3 (three) days.  Marland Kitchen lisinopril (PRINIVIL,ZESTRIL) 2.5 MG tablet Take by mouth.  . loratadine (CLARITIN) 10 MG tablet Take by mouth.  . metoprolol tartrate (LOPRESSOR) 25 MG tablet Take by mouth 2 (two) times daily.   . Multiple Vitamin (MULTIVITAMIN) tablet Take 1 tablet by mouth daily.  . ranitidine (ZANTAC) 150 MG tablet Take by mouth.  . warfarin (COUMADIN) 4 MG tablet Take by mouth.   No facility-administered encounter medications on file as of 08/18/2017.     Past Medical History:  Diagnosis Date  . Diabetes ([place]) 1997   Last A1C 6.2  . HTN (hypertension)   . S/P aortic valve replacement     Past Surgical History:  Procedure Laterality Date  . BACK SURGERY    . CARDIAC SURGERY    . CATARACT EXTRACTION, BILATERAL    . HEMORROIDECTOMY    . KNEE SURGERY Left     Social History   Social History  . Marital status: Unknown    Spouse name: N/A  . Number of children: N/A  . Years of education: N/A   Occupational History  . Not on file.   Social History Main Topics  . Smoking status: Never Smoker  . Smokeless tobacco: Never Used  . Alcohol use 0.0 oz/week     Comment: very rare  . Drug use: No  . Sexual activity: Not on file   Other Topics Concern  . Not on file   Social History Narrative  . No narrative on file    Family Status  Relation Status  . Mother Deceased  . Father Deceased       accident  . Sister Alive  . Sister Alive    Review of Systems A complete 10 system ROS was obtained and was negative apart from what is mentioned.   Objective:   VITALS:   Vitals:   08/18/17 1501  BP: 96/60  Pulse: 60  SpO2: 96%  Weight: 245 lb (111.1 kg)  Height: 5\' 10"  (1.778 m)   Gen:  Appears stated age and in NAD. HEENT:   Normocephalic, atraumatic. The mucous membranes are moist. The superficial temporal arteries are without ropiness or tenderness. Cardiovascular: Regular rate and rhythm. Lungs: Clear to auscultation bilaterally. Neck: There are no carotid bruits noted bilaterally.  Neurological examination:  Orientation:  The patient is alert and oriented x3. Cranial nerves: There is good facial symmetry. The speech is fluent and clear. Soft palate rises symmetrically and there is no tongue deviation. Hearing is intact to conversational tone. Sensation: Sensation is intact to light touch throughout Motor: Strength is 5/5 in the bilateral upper and lower extremities.   Shoulder shrug is equal and symmetric.  There is no pronator drift.  Movement examination: Tone: There is normal tone in the RUE.  There is mild increased tone in the LUE.  There is normal tone in the bilateral lower extremities Abnormal movements: There is a RUE and LUE resting tremor today Coordination:  There is  decremation with RAM's, with finger taps on the L and alternation of supination/pronation of the forearm on the L Gait and Station:   The patient is able to ambulate without difficulty. He is wide based.  He has L foot drop.     LABS  TSH done 03/24/16 2.83  Lab Results  Component Value Date   HGBA1C 6.9 05/06/2017        Assessment/Plan:   1.  Tremor  -I had quite a long discussion with the patient and his wife today.  He is very close to meeting [place] brain bank criteria for Parkinson's disease.  He has gone from unilateral to bilateral tremor.  He is just starting to barely have rigidity.  I told him I suspect that within the next 6 months to 1 year he will meet formal criteria.  At this point, we decided to hold off on any medication, but talked about the importance of safe, cardiovascular exercise.  We talked about what that meant in detail.  He and his wife asked me multiple questions that I answered them to the best of my  ability.  -His insurance just changed and he could now have DaT but not sure that this would change treatment.    2.  L foot drop  -probably due to L5-S1 radiculopathy and he is s/p surgical intervention and no longer feels pain.    3.  Diabetic peripheral neuropathy  -talked about safety.  Is the likely etiology for balance issues.    4.  Paracliniod meningioma  -He is now following with Dr. Vertell Berger.  He is supposed to have a repeat MRI with Dr. Vertell Berger one year from April, 2018 per Dr. Donald Pore reports, but the patient reports he has a follow up in October, 2018 and thinks he has a scan scheduled before that.  5.  HTN  -He is on 2 different BP meds but reports he didn't take them today.  I asked him to hold off on taking those medications today given low blood pressure.  He is to make a follow-up with his primary care physician.  6.  Constipation  -I asked him to increase his water intake.  He was given a copy of the rancho recipe.  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  This has not become dangerous.  We talked about the association with Parkinson's disease.  8.  Follow up is anticipated in the next 6 months, sooner should new neurologic issues arise.  Much greater than 50% of this visit was spent in counseling and coordinating care.  Total face to face time:  40 min

## 2017-08-18 ENCOUNTER — Ambulatory Visit (INDEPENDENT_AMBULATORY_CARE_PROVIDER_SITE_OTHER): Payer: Medicare Other | Admitting: Neurology

## 2017-08-18 ENCOUNTER — Encounter: Payer: Self-pay | Admitting: Neurology

## 2017-08-18 VITALS — BP 96/60 | HR 60 | Ht 70.0 in | Wt 245.0 lb

## 2017-08-18 DIAGNOSIS — G20 Parkinson's disease: Secondary | ICD-10-CM

## 2017-08-18 DIAGNOSIS — D329 Benign neoplasm of meninges, unspecified: Secondary | ICD-10-CM

## 2017-08-18 DIAGNOSIS — K5901 Slow transit constipation: Secondary | ICD-10-CM

## 2017-08-18 DIAGNOSIS — G4752 REM sleep behavior disorder: Secondary | ICD-10-CM

## 2017-08-18 DIAGNOSIS — G20C Parkinsonism, unspecified: Secondary | ICD-10-CM

## 2017-08-18 NOTE — Patient Instructions (Addendum)
Constipation and Parkinson's disease:  1.Rancho recipe for constipation in Parkinsons Disease:  -1 cup of unprocessed bran (need to get this at [employer], [employer] or similar type of store), 2 cups of applesauce in 1 cup of prune juice 2.  Increase fiber intake (Metamucil,vegetables) 3.  Regular, moderate exercise can be beneficial. 4.  Avoid medications causing constipation, such as medications like antacids with calcium or magnesium 5.  Laxative overuse should be avoided. 6.  Stool softeners (Colace) can help with chronic constipation. 7.  Increase water intake.  You should be drinking 1/2 gallon of water a day as long as you have not been diagnosed with congestive heart failure or renal/kidney failure.  This is probably the single greatest thing that you can do to help your constipation.  Continue to exercise!!!

## 2017-08-25 DIAGNOSIS — Z7901 Long term (current) use of anticoagulants: Secondary | ICD-10-CM | POA: Diagnosis not present

## 2017-08-25 DIAGNOSIS — I872 Venous insufficiency (chronic) (peripheral): Secondary | ICD-10-CM | POA: Diagnosis not present

## 2017-08-25 DIAGNOSIS — I2510 Atherosclerotic heart disease of native coronary artery without angina pectoris: Secondary | ICD-10-CM | POA: Diagnosis not present

## 2017-08-25 DIAGNOSIS — I10 Essential (primary) hypertension: Secondary | ICD-10-CM | POA: Diagnosis not present

## 2017-08-25 DIAGNOSIS — Z13220 Encounter for screening for lipoid disorders: Secondary | ICD-10-CM | POA: Diagnosis not present

## 2017-08-25 DIAGNOSIS — G20 Parkinson's disease: Secondary | ICD-10-CM | POA: Diagnosis not present

## 2017-08-25 DIAGNOSIS — E119 Type 2 diabetes mellitus without complications: Secondary | ICD-10-CM | POA: Diagnosis not present

## 2017-08-25 DIAGNOSIS — I359 Nonrheumatic aortic valve disorder, unspecified: Secondary | ICD-10-CM | POA: Diagnosis not present

## 2017-09-06 ENCOUNTER — Ambulatory Visit (INDEPENDENT_AMBULATORY_CARE_PROVIDER_SITE_OTHER): Payer: Medicare Other | Admitting: Endocrinology

## 2017-09-06 ENCOUNTER — Encounter: Payer: BLUE CROSS/BLUE SHIELD | Admitting: Nutrition

## 2017-09-06 ENCOUNTER — Encounter: Payer: Self-pay | Admitting: Endocrinology

## 2017-09-06 VITALS — BP 136/68 | HR 61 | Wt 248.4 lb

## 2017-09-06 DIAGNOSIS — L905 Scar conditions and fibrosis of skin: Secondary | ICD-10-CM | POA: Diagnosis not present

## 2017-09-06 DIAGNOSIS — L814 Other melanin hyperpigmentation: Secondary | ICD-10-CM | POA: Diagnosis not present

## 2017-09-06 DIAGNOSIS — D485 Neoplasm of uncertain behavior of skin: Secondary | ICD-10-CM | POA: Diagnosis not present

## 2017-09-06 DIAGNOSIS — Z794 Long term (current) use of insulin: Secondary | ICD-10-CM | POA: Diagnosis not present

## 2017-09-06 DIAGNOSIS — Z23 Encounter for immunization: Secondary | ICD-10-CM

## 2017-09-06 DIAGNOSIS — D2339 Other benign neoplasm of skin of other parts of face: Secondary | ICD-10-CM | POA: Diagnosis not present

## 2017-09-06 DIAGNOSIS — E0800 Diabetes mellitus due to underlying condition with hyperosmolarity without nonketotic hyperglycemic-hyperosmolar coma (NKHHC): Secondary | ICD-10-CM

## 2017-09-06 LAB — POCT GLYCOSYLATED HEMOGLOBIN (HGB A1C): HEMOGLOBIN A1C: 6.5

## 2017-09-06 NOTE — Patient Instructions (Addendum)
check your blood sugar 4 times a day: before the 3 meals, and at bedtime.  also check if you have symptoms of your blood sugar being too high or too low.  please keep a record of the readings and bring it to your next appointment here (or you can bring the meter itself).  You can write it on any piece of paper.  please call us sooner if your blood sugar goes below 70, or if you have a lot of readings over 200.  Please take these pump settings for now:  basal rate of 2.3 units/HR, 7 AM-10 PM, and 0.8 units/HR 10PM-7AM.   No mealtime bolus.  correction bolus (which some people call "sensitivity," or "insulin sensitivity ratio," or just "isr") of 1 unit for each 25 by which your glucose exceeds 100.  For activity, suspend pump x 1-2 hrs. Please come back for a follow-up appointment in 2 months.

## 2017-09-06 NOTE — Progress Notes (Signed)
Subjective:    Patient ID: Bradley Berger, male    DOB: [birth year], 66 y.o.   MRN: [id number]  HPI Pt returns for f/u of diabetes mellitus: DM type: Insulin-requiring type 2 Dx'ed: 7619 Complications: polyneuropathy and CAD Therapy: insulin since 1999 DKA: never Severe hypoglycemia: never Pancreatitis: never Other: he uses a medtronic paradigm pump; we stopped mealtime boluses, as pt was forgetting them.   Interval history:  He takes these pump settings:  basal rate of 2.4 units/HR, 7 AM-10 PM, and 1.2 units/HR 10PM-7AM.   No mealtime bolus.   correction bolus (which some people call "sensitivity," or "insulin sensitivity ratio," or just "isr") of 1 unit for each 25 by which your glucose exceeds 100.  For activity, suspend pump x 1-2 hrs.   no cbg record, but states cbg's are in the 10's.  It is in general higher as the day goes on.  He denies hypoglycemia  Past Medical History:  Diagnosis Date  . Diabetes ([place]) 1997   Last A1C 6.2  . HTN (hypertension)   . Meningioma ([place])   . Parkinson disease ([place])   . S/P aortic valve replacement   . Sudden hearing loss    L ear    Past Surgical History:  Procedure Laterality Date  . BACK SURGERY    . CARDIAC SURGERY    . CATARACT EXTRACTION, BILATERAL    . HEMORROIDECTOMY    . KNEE SURGERY Left     Social History   Social History  . Marital status: Married    Spouse name: N/A  . Number of children: N/A  . Years of education: N/A   Occupational History  . Not on file.   Social History Main Topics  . Smoking status: Never Smoker  . Smokeless tobacco: Never Used  . Alcohol use 0.0 oz/week     Comment: very rare  . Drug use: No  . Sexual activity: Not on file   Other Topics Concern  . Not on file   Social History Narrative  . No narrative on file    Current Outpatient Prescriptions on File Prior to Visit  Medication Sig Dispense Refill  . aspirin EC 81 MG tablet Take 81 mg by mouth daily.     . Cholecalciferol  (VITAMIN D3) 5000 units CAPS Take 5,000 Units by mouth every morning.     . famotidine (PEPCID) 10 MG tablet Take 10 mg by mouth every morning.     . gabapentin (NEURONTIN) 600 MG tablet Take by mouth.    . insulin glulisine (APIDRA) 100 UNIT/ML injection For use in pump, for a total of 60 units/day (Patient taking differently: Inject 0.8-1.6 Units into the skin every hour. For use in pump .8 units per hour from 2300-0700 1.6 units per hour from 0700-2300) 60 mL 3  . Insulin Infusion Pump Supplies (MINIMED INFUSION SET-MMT 397) MISC 1 Device by Does not apply route every 3 (three) days.    . Insulin Infusion Pump Supplies (MINIMED RESERVOIR 3ML) MISC 1 Device by Does not apply route every 3 (three) days.    Marland Kitchen lisinopril (PRINIVIL,ZESTRIL) 2.5 MG tablet Take by mouth.    . loratadine (CLARITIN) 10 MG tablet Take by mouth.    . metoprolol tartrate (LOPRESSOR) 25 MG tablet Take by mouth 2 (two) times daily.     . Multiple Vitamin (MULTIVITAMIN) tablet Take 1 tablet by mouth daily.    . ranitidine (ZANTAC) 150 MG tablet Take by mouth.    . warfarin (  COUMADIN) 4 MG tablet Take by mouth.     No current facility-administered medications on file prior to visit.     Allergies  Allergen Reactions  . Dilaudid [Hydromorphone Hcl] Other (See Comments)    "flushing"  . Morphine Other (See Comments)    "flushing"    Family History  Problem Relation Age of Onset  . Lung cancer Mother     BP 136/68   Pulse 61   Wt 248 lb 6.4 oz (112.7 kg)   SpO2 98%   BMI 35.64 kg/m    Review of Systems He has gained weight    Objective:   Physical Exam VITAL SIGNS:  See vs page GENERAL: no distress Pulses: foot pulses are intact bilaterally.   MSK: no deformity of the feet or ankles.  CV: 1+ bilat edema of the legs. Skin:  no ulcer on the feet or ankles.  normal color and temp on the feet and ankles Neuro: sensation is intact to touch on the feet and ankles.    Lab Results  Component Value Date     HGBA1C 6.5 09/06/2017       Assessment & Plan:  Insulin-requiring type 2 DM, with CAD: The pattern of his cbg's indicates he needs some adjustment in his therapy.  The strategy of emphasizing the basal is working.    Patient Instructions  check your blood sugar 4 times a day: before the 3 meals, and at bedtime.  also check if you have symptoms of your blood sugar being too high or too low.  please keep a record of the readings and bring it to your next appointment here (or you can bring the meter itself).  You can write it on any piece of paper.  please call us sooner if your blood sugar goes below 70, or if you have a lot of readings over 200.  Please take these pump settings for now:  basal rate of 2.3 units/HR, 7 AM-10 PM, and 0.8 units/HR 10PM-7AM.   No mealtime bolus.  correction bolus (which some people call "sensitivity," or "insulin sensitivity ratio," or just "isr") of 1 unit for each 25 by which your glucose exceeds 100.  For activity, suspend pump x 1-2 hrs. Please come back for a follow-up appointment in 2 months.

## 2017-09-07 ENCOUNTER — Emergency Department (HOSPITAL_COMMUNITY)
Admission: EM | Admit: 2017-09-07 | Discharge: 2017-09-08 | Disposition: A | Discharge status: Home / Self Care | Payer: Medicare Other | Attending: Emergency Medicine | Admitting: Emergency Medicine

## 2017-09-07 ENCOUNTER — Encounter (HOSPITAL_COMMUNITY): Payer: Self-pay | Admitting: Emergency Medicine

## 2017-09-07 ENCOUNTER — Emergency Department (HOSPITAL_COMMUNITY): Payer: Medicare Other

## 2017-09-07 ENCOUNTER — Other Ambulatory Visit: Payer: Self-pay

## 2017-09-07 DIAGNOSIS — R9431 Abnormal electrocardiogram [ECG] [EKG]: Secondary | ICD-10-CM | POA: Diagnosis not present

## 2017-09-07 DIAGNOSIS — Z794 Long term (current) use of insulin: Secondary | ICD-10-CM | POA: Insufficient documentation

## 2017-09-07 DIAGNOSIS — R29810 Facial weakness: Secondary | ICD-10-CM | POA: Diagnosis not present

## 2017-09-07 DIAGNOSIS — I10 Essential (primary) hypertension: Secondary | ICD-10-CM | POA: Diagnosis not present

## 2017-09-07 DIAGNOSIS — E119 Type 2 diabetes mellitus without complications: Secondary | ICD-10-CM | POA: Diagnosis not present

## 2017-09-07 DIAGNOSIS — Z885 Allergy status to narcotic agent status: Secondary | ICD-10-CM | POA: Insufficient documentation

## 2017-09-07 DIAGNOSIS — G510 Bell's palsy: Secondary | ICD-10-CM

## 2017-09-07 DIAGNOSIS — R200 Anesthesia of skin: Secondary | ICD-10-CM | POA: Diagnosis present

## 2017-09-07 DIAGNOSIS — Z7982 Long term (current) use of aspirin: Secondary | ICD-10-CM | POA: Diagnosis not present

## 2017-09-07 DIAGNOSIS — Z79899 Other long term (current) drug therapy: Secondary | ICD-10-CM | POA: Insufficient documentation

## 2017-09-07 DIAGNOSIS — Z7901 Long term (current) use of anticoagulants: Secondary | ICD-10-CM | POA: Diagnosis not present

## 2017-09-07 HISTORY — DX: Benign neoplasm of meninges, unspecified: D32.9

## 2017-09-07 HISTORY — DX: Parkinson's disease: G20

## 2017-09-07 HISTORY — DX: Sudden idiopathic hearing loss, unspecified ear: H91.20

## 2017-09-07 HISTORY — DX: Parkinson's disease without dyskinesia, without mention of fluctuations: G20.A1

## 2017-09-07 LAB — DIFFERENTIAL
BASOS PCT: 0 %
Basophils Absolute: 0 10*3/uL (ref 0.0–0.1)
EOS PCT: 6 %
Eosinophils Absolute: 0.3 10*3/uL (ref 0.0–0.7)
LYMPHS PCT: 27 %
Lymphs Abs: 1.4 10*3/uL (ref 0.7–4.0)
MONOS PCT: 9 %
Monocytes Absolute: 0.5 10*3/uL (ref 0.1–1.0)
NEUTROS ABS: 3.1 10*3/uL (ref 1.7–7.7)
Neutrophils Relative %: 58 %

## 2017-09-07 LAB — I-STAT CHEM 8, ED
BUN: 20 mg/dL (ref 6–20)
CREATININE: 1 mg/dL (ref 0.61–1.24)
Calcium, Ion: 1.1 mmol/L — ABNORMAL LOW (ref 1.15–1.40)
Chloride: 103 mmol/L (ref 101–111)
Glucose, Bld: 186 mg/dL — ABNORMAL HIGH (ref 65–99)
HEMATOCRIT: 39 % (ref 39.0–52.0)
HEMOGLOBIN: 13.3 g/dL (ref 13.0–17.0)
Potassium: 4.1 mmol/L (ref 3.5–5.1)
Sodium: 140 mmol/L (ref 135–145)
TCO2: 27 mmol/L (ref 22–32)

## 2017-09-07 LAB — I-STAT TROPONIN, ED: Troponin i, poc: 0.01 ng/mL (ref 0.00–0.08)

## 2017-09-07 LAB — COMPREHENSIVE METABOLIC PANEL
ALK PHOS: 38 U/L (ref 38–126)
ALT: 20 U/L (ref 17–63)
ANION GAP: 9 (ref 5–15)
AST: 23 U/L (ref 15–41)
Albumin: 3.6 g/dL (ref 3.5–5.0)
BUN: 16 mg/dL (ref 6–20)
CO2: 23 mmol/L (ref 22–32)
Calcium: 8.8 mg/dL — ABNORMAL LOW (ref 8.9–10.3)
Chloride: 104 mmol/L (ref 101–111)
Creatinine, Ser: 1.09 mg/dL (ref 0.61–1.24)
Glucose, Bld: 186 mg/dL — ABNORMAL HIGH (ref 65–99)
POTASSIUM: 4.1 mmol/L (ref 3.5–5.1)
Sodium: 136 mmol/L (ref 135–145)
TOTAL PROTEIN: 7.1 g/dL (ref 6.5–8.1)
Total Bilirubin: 0.6 mg/dL (ref 0.3–1.2)

## 2017-09-07 LAB — CBC
HEMATOCRIT: 38.2 % — AB (ref 39.0–52.0)
Hemoglobin: 12.1 g/dL — ABNORMAL LOW (ref 13.0–17.0)
MCH: 26.3 pg (ref 26.0–34.0)
MCHC: 31.7 g/dL (ref 30.0–36.0)
MCV: 83 fL (ref 78.0–100.0)
PLATELETS: 154 10*3/uL (ref 150–400)
RBC: 4.6 MIL/uL (ref 4.22–5.81)
RDW: 14.9 % (ref 11.5–15.5)
WBC: 5.3 10*3/uL (ref 4.0–10.5)

## 2017-09-07 LAB — PROTIME-INR
INR: 2.65
Prothrombin Time: 28 seconds — ABNORMAL HIGH (ref 11.4–15.2)

## 2017-09-07 LAB — CBG MONITORING, ED: GLUCOSE-CAPILLARY: 201 mg/dL — AB (ref 65–99)

## 2017-09-07 LAB — APTT: aPTT: 44 seconds — ABNORMAL HIGH (ref 24–36)

## 2017-09-07 MED ORDER — ARTIFICIAL TEARS OPHTHALMIC OINT
1.0000 | TOPICAL_OINTMENT | OPHTHALMIC | Status: DC | PRN
Start: 2017-09-07 — End: 2017-09-08
  Filled 2017-09-07: qty 3.5

## 2017-09-07 MED ORDER — PREDNISONE 20 MG PO TABS
60.0000 mg | ORAL_TABLET | Freq: Once | ORAL | Status: AC
  Administered 2017-09-08: 60 mg via ORAL
  Filled 2017-09-07: qty 3

## 2017-09-07 NOTE — ED Triage Notes (Signed)
Pt sent her from [place] by private vehicle. Pt had onset of R facial numbness that started last night approx 2000. Pt also noted to have R facial droop. Pt neurologist is here, concerned for stroke vs Bells Palsy

## 2017-09-08 DIAGNOSIS — G510 Bell's palsy: Secondary | ICD-10-CM | POA: Diagnosis not present

## 2017-09-08 MED ORDER — PREDNISONE 50 MG PO TABS: ORAL_TABLET | ORAL | 0 refills | Status: DC

## 2017-09-08 MED ORDER — ARTIFICIAL TEARS OPHTHALMIC OINT: TOPICAL_OINTMENT | OPHTHALMIC | 0 refills | Status: DC | PRN

## 2017-09-08 NOTE — ED Provider Notes (Signed)
[facility] DEPT Provider Note   CSN: [id number] Arrival date & time: 09/07/17  2047     History   Chief Complaint Chief Complaint  Patient presents with  . Numbness    HPI Wes Lezotte is a 66 y.o. male.  The history is provided by the patient and the spouse.  Weakness  Primary symptoms include focal weakness. This is a new problem. The current episode started yesterday. The problem has been gradually worsening. There was right facial focality noted. There has been no fever. Associated symptoms include headaches. Pertinent negatives include no shortness of breath, no chest pain, no vomiting, no altered mental status and no confusion. Associated symptoms comments: Mild headache . Associated medical issues do not include trauma.   Pt with h/o diabetes, parkinson, meningioma (stable) AV replacement on coumadin presents with right facial/forehead weakness and difficulty closing eyelid He reports yesterday he had onset of "eye burning" or felt like something was in his eye He then noted he could not close his eyelid Then wife noted right facial droop He went to urgent care in [place] who thought he may have stroke and sent for evaluation No h/o stroke No arm/leg weakness Only has mild HA No hearing changes No rash No cp Minimal numbness to face No change in taste   Past Medical History:  Diagnosis Date  . Diabetes ([place]) 1997   Last A1C 6.2  . HTN (hypertension)   . Meningioma ([place])   . Parkinson disease ([place])   . S/P aortic valve replacement   . Sudden hearing loss    L ear    Patient Active Problem List   Diagnosis Date Noted  . HTN (hypertension)   . S/P aortic valve replacement   . Diabetes [facility])     Past Surgical History:  Procedure Laterality Date  . BACK SURGERY    . CARDIAC SURGERY    . CATARACT EXTRACTION, BILATERAL    . HEMORROIDECTOMY    . KNEE SURGERY Left        Home Medications    Prior to Admission medications   Medication Sig  Start Date End Date Taking? Authorizing Provider  aspirin EC 81 MG tablet Take 81 mg by mouth daily.    Yes [provider]  Cholecalciferol (VITAMIN D3) 5000 units CAPS Take 5,000 Units by mouth every morning.    Yes [provider]  famotidine (PEPCID) 10 MG tablet Take 10 mg by mouth every morning.    Yes [provider]  insulin glulisine (APIDRA) 100 UNIT/ML injection For use in pump, for a total of 60 units/day Patient taking differently: Inject 0.8-1.6 Units into the skin every hour. For use in pump .8 units per hour from 2300-0700 1.6 units per hour from 0700-2300 03/09/17  Yes Renato Shin, MD  artificial tears (LACRILUBE) OINT ophthalmic ointment Place into the right eye every 3 (three) hours as needed for dry eyes. 09/08/17   Ripley Fraise, MD  gabapentin (NEURONTIN) 600 MG tablet Take by mouth.    [provider]  Insulin Infusion Pump Supplies (MINIMED INFUSION SET-MMT 397) MISC 1 Device by Does not apply route every 3 (three) days.    [provider]  Insulin Infusion Pump Supplies (MINIMED RESERVOIR 3ML) MISC 1 Device by Does not apply route every 3 (three) days.    [provider]  lisinopril (PRINIVIL,ZESTRIL) 2.5 MG tablet Take by mouth.    [provider]  loratadine (CLARITIN) 10 MG tablet Take by mouth.  [provider]  metoprolol tartrate (LOPRESSOR) 25 MG tablet Take by mouth 2 (two) times daily.     [provider]  Multiple Vitamin (MULTIVITAMIN) tablet Take 1 tablet by mouth daily.    [provider]  predniSONE (DELTASONE) 50 MG tablet One tablet PO daily for 6 days Please check your sugar frequently when on this medicine 09/08/17   Ripley Fraise, MD  ranitidine (ZANTAC) 150 MG tablet Take by mouth.    [provider]  warfarin (COUMADIN) 4 MG tablet Take by mouth.    [provider]    Family History Family History  Problem Relation Age of Onset  .  Lung cancer Mother     Social History Social History  Substance Use Topics  . Smoking status: Never Smoker  . Smokeless tobacco: Never Used  . Alcohol use 0.0 oz/week     Comment: very rare     Allergies   Dilaudid [hydromorphone hcl] and Morphine   Review of Systems Review of Systems  Constitutional: Negative for fever.  Respiratory: Negative for shortness of breath.   Cardiovascular: Negative for chest pain.  Gastrointestinal: Negative for vomiting.  Neurological: Positive for focal weakness, weakness and headaches.  Psychiatric/Behavioral: Negative for confusion.  All other systems reviewed and are negative.    Physical Exam Updated Vital Signs BP 132/66   Pulse 65   Temp 98 F (36.7 C) (Oral)   Resp 16   Ht 1.778 m (5\' 10" )   Wt 108.9 kg (240 lb)   SpO2 100%   BMI 34.44 kg/m   Physical Exam  CONSTITUTIONAL: Well developed/well nourished HEAD: Normocephalic/atraumatic EYES: EOMI/PERRL, no nystagmus ENMT: Mucous membranes moist, no rash to face/right ear NECK: supple no meningeal signs, no bruits CV: S1/S2 noted, murmur noted LUNGS: Lungs are clear to auscultation bilaterally, no apparent distress ABDOMEN: soft, nontender, no rebound or guarding, insulin pump to abdomen GU:no cva tenderness NEURO:Awake/alert,right facial droop, no arm or leg drift is noted He has weakness closing right eyelid and weakness noted to right forehead Equal 5/5 strength with shoulder abduction, elbow flex/extension, wrist flex/extension in upper extremities and equal hand grips bilaterall Equal 5/5 strength with hip flexion,knee flex/extension, foot dorsi/plantar flexion Cranial nerves 3/4/5/05/21/09/11/12 tested and intact Gait normal without ataxia No past pointing Sensation to light touch intact in all extremities EXTREMITIES: pulses normal, full ROM SKIN: warm, color normal PSYCH: no abnormalities of mood noted  ED Treatments / Results  Labs (all labs ordered are  listed, but only abnormal results are displayed) Labs Reviewed  PROTIME-INR - Abnormal; Notable for the following:       Result Value   Prothrombin Time 28.0 (*)    All other components within normal limits  APTT - Abnormal; Notable for the following:    aPTT 44 (*)    All other components within normal limits  CBC - Abnormal; Notable for the following:    Hemoglobin 12.1 (*)    HCT 38.2 (*)    All other components within normal limits  COMPREHENSIVE METABOLIC PANEL - Abnormal; Notable for the following:    Glucose, Bld 186 (*)    Calcium 8.8 (*)    All other components within normal limits  CBG MONITORING, ED - Abnormal; Notable for the following:    Glucose-Capillary 201 (*)    All other components within normal limits  I-STAT CHEM 8, ED - Abnormal; Notable for the following:    Glucose, Bld 186 (*)    Calcium, Ion  1.10 (*)    All other components within normal limits  DIFFERENTIAL  I-STAT TROPONIN, ED    EKG  EKG Interpretation  Date/Time:  Wednesday September 07 2017 21:59:01 EDT Ventricular Rate:  66 PR Interval:  178 QRS Duration: 88 QT Interval:  392 QTC Calculation: 410 R Axis:   22 Text Interpretation:  Normal sinus rhythm Cannot rule out Anterior infarct , age undetermined Abnormal ECG Confirmed by Ripley Fraise [phone]) on 09/07/2017 11:11:54 PM       Radiology Ct Head Wo Contrast  Result Date: 09/07/2017 CLINICAL DATA:  Right-sided facial numbness and droop that began last evening. EXAM: CT HEAD WITHOUT CONTRAST TECHNIQUE: Contiguous axial images were obtained from the base of the skull through the vertex without intravenous contrast. COMPARISON:  03/06/2017 MRI FINDINGS: Brain: Right temporal lobe partially calcified 13 x 8 mm meningioma adjacent to the right anterior clinoid process is stable. No intracranial hemorrhage, large vascular territory infarct, intra-axial mass nor extra-axial fluid collections. No hydrocephalus. No effacement of the basal  cisterns or fourth ventricle. Chronic small right cerebellar infarct. Vascular: Mild atherosclerosis of the carotid siphons. No hyperdense vessels. Skull: Normal. Negative for fracture or focal lesion. Sinuses/Orbits: No acute finding. Other: None. IMPRESSION: 1. Chronic stable partially calcified meningioma in the right temporal lobe fossa. This measures approximately 13 x 8 mm. 2. 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 3. No acute intracranial abnormality by CT. Electronically Signed   By: Ashley Royalty M.D.   On: 09/07/2017 22:22    Procedures Procedures (including critical care time)  Medications Ordered in ED Medications  artificial tears (LACRILUBE) ophthalmic ointment 1 application (not administered)  predniSONE (DELTASONE) tablet 60 mg (60 mg Oral Given 09/08/17 0000)     Initial Impression / Assessment and Plan / ED Course  I have reviewed the triage vital signs and the nursing notes.  Pertinent labs & imaging results that were available during my care of the patient were reviewed by me and considered in my medical decision making (see chart for details).     Pt stable Strong suspicion for bells palsy Start prednisone but needs to monitor glucose Given lacrilube and discussed eye  Protection I reviewed CT imaging, I don't feel meningioma is related and no change from previous studies INR therapeutic He can f/u with his neurologist We discussed strict ER return precautions   Final Clinical Impressions(s) / ED Diagnoses   Final diagnoses:  Bell's palsy    New Prescriptions Discharge Medication List as of 09/08/2017 12:06 AM    START taking these medications   Details  artificial tears (LACRILUBE) OINT ophthalmic ointment Place into the right eye every 3 (three) hours as needed for dry eyes., Starting Thu 09/08/2017, Print    predniSONE (DELTASONE) 50 MG tablet One tablet PO daily for 6 days Please check your sugar frequently when on this medicine, Print          Ripley Fraise, MD 09/08/17 [phone]

## 2017-09-09 ENCOUNTER — Telehealth: Payer: Self-pay | Admitting: Endocrinology

## 2017-09-09 DIAGNOSIS — H1711 Central corneal opacity, right eye: Secondary | ICD-10-CM | POA: Diagnosis not present

## 2017-09-09 DIAGNOSIS — G510 Bell's palsy: Secondary | ICD-10-CM | POA: Diagnosis not present

## 2017-09-09 DIAGNOSIS — E119 Type 2 diabetes mellitus without complications: Secondary | ICD-10-CM | POA: Diagnosis not present

## 2017-09-09 DIAGNOSIS — H40013 Open angle with borderline findings, low risk, bilateral: Secondary | ICD-10-CM | POA: Diagnosis not present

## 2017-09-09 NOTE — Telephone Encounter (Signed)
Patient's wife Jana Half called began to have paralysis in face and eye on Tuesday night so they went to urgent care and ended up in [facility] ER. The ER diagnosed him with Bells Palsy and gave him prednisone. Patinet's blood sugar numbers have been between 200-300. Call patient's wife at [phone] to advise.

## 2017-09-09 NOTE — Telephone Encounter (Signed)
Please review

## 2017-09-09 NOTE — Telephone Encounter (Signed)
Please increase correction bolus (which some people call "sensitivity," or "insulin sensitivity ratio," or just "isr") to 1 unit for each 10 by which your glucose exceeds 100.  You can take this as often as every 2 hrs. Please call or message [place] next week, to tell us how the blood sugar is doing

## 2017-09-09 NOTE — Telephone Encounter (Signed)
Called and left VM to call back so I could give them directions for correct bolus.

## 2017-09-13 DIAGNOSIS — H40013 Open angle with borderline findings, low risk, bilateral: Secondary | ICD-10-CM | POA: Diagnosis not present

## 2017-09-13 DIAGNOSIS — H16211 Exposure keratoconjunctivitis, right eye: Secondary | ICD-10-CM | POA: Diagnosis not present

## 2017-09-13 DIAGNOSIS — H1711 Central corneal opacity, right eye: Secondary | ICD-10-CM | POA: Diagnosis not present

## 2017-09-13 DIAGNOSIS — G510 Bell's palsy: Secondary | ICD-10-CM | POA: Diagnosis not present

## 2017-09-13 DIAGNOSIS — C719 Malignant neoplasm of brain, unspecified: Secondary | ICD-10-CM | POA: Diagnosis not present

## 2017-09-13 DIAGNOSIS — E119 Type 2 diabetes mellitus without complications: Secondary | ICD-10-CM | POA: Diagnosis not present

## 2017-09-20 DIAGNOSIS — H40013 Open angle with borderline findings, low risk, bilateral: Secondary | ICD-10-CM | POA: Diagnosis not present

## 2017-09-20 DIAGNOSIS — H16211 Exposure keratoconjunctivitis, right eye: Secondary | ICD-10-CM | POA: Diagnosis not present

## 2017-09-20 DIAGNOSIS — C719 Malignant neoplasm of brain, unspecified: Secondary | ICD-10-CM | POA: Diagnosis not present

## 2017-09-20 DIAGNOSIS — I10 Essential (primary) hypertension: Secondary | ICD-10-CM | POA: Diagnosis not present

## 2017-09-20 DIAGNOSIS — G510 Bell's palsy: Secondary | ICD-10-CM | POA: Diagnosis not present

## 2017-09-20 DIAGNOSIS — H43811 Vitreous degeneration, right eye: Secondary | ICD-10-CM | POA: Diagnosis not present

## 2017-09-20 DIAGNOSIS — I359 Nonrheumatic aortic valve disorder, unspecified: Secondary | ICD-10-CM | POA: Diagnosis not present

## 2017-09-20 DIAGNOSIS — H1711 Central corneal opacity, right eye: Secondary | ICD-10-CM | POA: Diagnosis not present

## 2017-09-20 DIAGNOSIS — E119 Type 2 diabetes mellitus without complications: Secondary | ICD-10-CM | POA: Diagnosis not present

## 2017-09-21 DIAGNOSIS — S298XXA Other specified injuries of thorax, initial encounter: Secondary | ICD-10-CM | POA: Diagnosis not present

## 2017-09-26 DIAGNOSIS — Z13220 Encounter for screening for lipoid disorders: Secondary | ICD-10-CM | POA: Diagnosis not present

## 2017-09-26 DIAGNOSIS — I359 Nonrheumatic aortic valve disorder, unspecified: Secondary | ICD-10-CM | POA: Diagnosis not present

## 2017-09-26 DIAGNOSIS — I10 Essential (primary) hypertension: Secondary | ICD-10-CM | POA: Diagnosis not present

## 2017-09-26 DIAGNOSIS — I872 Venous insufficiency (chronic) (peripheral): Secondary | ICD-10-CM | POA: Diagnosis not present

## 2017-09-26 DIAGNOSIS — I2510 Atherosclerotic heart disease of native coronary artery without angina pectoris: Secondary | ICD-10-CM | POA: Diagnosis not present

## 2017-09-26 DIAGNOSIS — Z7901 Long term (current) use of anticoagulants: Secondary | ICD-10-CM | POA: Diagnosis not present

## 2017-09-26 DIAGNOSIS — G20 Parkinson's disease: Secondary | ICD-10-CM | POA: Diagnosis not present

## 2017-09-26 DIAGNOSIS — E119 Type 2 diabetes mellitus without complications: Secondary | ICD-10-CM | POA: Diagnosis not present

## 2017-09-27 DIAGNOSIS — G510 Bell's palsy: Secondary | ICD-10-CM | POA: Diagnosis not present

## 2017-09-27 DIAGNOSIS — H1711 Central corneal opacity, right eye: Secondary | ICD-10-CM | POA: Diagnosis not present

## 2017-09-27 DIAGNOSIS — E119 Type 2 diabetes mellitus without complications: Secondary | ICD-10-CM | POA: Diagnosis not present

## 2017-09-27 DIAGNOSIS — H43811 Vitreous degeneration, right eye: Secondary | ICD-10-CM | POA: Diagnosis not present

## 2017-09-27 DIAGNOSIS — H40013 Open angle with borderline findings, low risk, bilateral: Secondary | ICD-10-CM | POA: Diagnosis not present

## 2017-09-27 DIAGNOSIS — C719 Malignant neoplasm of brain, unspecified: Secondary | ICD-10-CM | POA: Diagnosis not present

## 2017-09-27 DIAGNOSIS — H16211 Exposure keratoconjunctivitis, right eye: Secondary | ICD-10-CM | POA: Diagnosis not present

## 2017-10-27 DIAGNOSIS — I872 Venous insufficiency (chronic) (peripheral): Secondary | ICD-10-CM | POA: Diagnosis not present

## 2017-10-27 DIAGNOSIS — I359 Nonrheumatic aortic valve disorder, unspecified: Secondary | ICD-10-CM | POA: Diagnosis not present

## 2017-10-27 DIAGNOSIS — I10 Essential (primary) hypertension: Secondary | ICD-10-CM | POA: Diagnosis not present

## 2017-10-27 DIAGNOSIS — I2510 Atherosclerotic heart disease of native coronary artery without angina pectoris: Secondary | ICD-10-CM | POA: Diagnosis not present

## 2017-10-27 DIAGNOSIS — E119 Type 2 diabetes mellitus without complications: Secondary | ICD-10-CM | POA: Diagnosis not present

## 2017-10-27 DIAGNOSIS — Z7901 Long term (current) use of anticoagulants: Secondary | ICD-10-CM | POA: Diagnosis not present

## 2017-10-27 DIAGNOSIS — Z13220 Encounter for screening for lipoid disorders: Secondary | ICD-10-CM | POA: Diagnosis not present

## 2017-10-27 DIAGNOSIS — G20 Parkinson's disease: Secondary | ICD-10-CM | POA: Diagnosis not present

## 2017-11-08 ENCOUNTER — Other Ambulatory Visit: Payer: Self-pay

## 2017-11-08 MED ORDER — INSULIN GLULISINE 100 UNIT/ML IJ SOLN: INTRAMUSCULAR | 3 refills | Status: DC

## 2017-11-09 ENCOUNTER — Ambulatory Visit: Payer: PRIVATE HEALTH INSURANCE | Admitting: Endocrinology

## 2017-11-23 DIAGNOSIS — K740 Hepatic fibrosis: Secondary | ICD-10-CM | POA: Diagnosis not present

## 2017-11-23 DIAGNOSIS — K760 Fatty (change of) liver, not elsewhere classified: Secondary | ICD-10-CM | POA: Diagnosis not present

## 2017-11-23 DIAGNOSIS — B182 Chronic viral hepatitis C: Secondary | ICD-10-CM | POA: Diagnosis not present

## 2017-11-24 ENCOUNTER — Ambulatory Visit (INDEPENDENT_AMBULATORY_CARE_PROVIDER_SITE_OTHER): Payer: Medicare Other | Admitting: Endocrinology

## 2017-11-24 ENCOUNTER — Encounter: Payer: Self-pay | Admitting: Endocrinology

## 2017-11-24 VITALS — BP 138/70 | HR 64 | Wt 249.4 lb

## 2017-11-24 DIAGNOSIS — Z794 Long term (current) use of insulin: Secondary | ICD-10-CM

## 2017-11-24 DIAGNOSIS — I10 Essential (primary) hypertension: Secondary | ICD-10-CM | POA: Diagnosis not present

## 2017-11-24 DIAGNOSIS — I2510 Atherosclerotic heart disease of native coronary artery without angina pectoris: Secondary | ICD-10-CM | POA: Diagnosis not present

## 2017-11-24 DIAGNOSIS — E119 Type 2 diabetes mellitus without complications: Secondary | ICD-10-CM | POA: Diagnosis not present

## 2017-11-24 DIAGNOSIS — Z13220 Encounter for screening for lipoid disorders: Secondary | ICD-10-CM | POA: Diagnosis not present

## 2017-11-24 DIAGNOSIS — Z7901 Long term (current) use of anticoagulants: Secondary | ICD-10-CM | POA: Diagnosis not present

## 2017-11-24 DIAGNOSIS — I359 Nonrheumatic aortic valve disorder, unspecified: Secondary | ICD-10-CM | POA: Diagnosis not present

## 2017-11-24 DIAGNOSIS — E0800 Diabetes mellitus due to underlying condition with hyperosmolarity without nonketotic hyperglycemic-hyperosmolar coma (NKHHC): Secondary | ICD-10-CM

## 2017-11-24 DIAGNOSIS — I872 Venous insufficiency (chronic) (peripheral): Secondary | ICD-10-CM | POA: Diagnosis not present

## 2017-11-24 DIAGNOSIS — G20 Parkinson's disease: Secondary | ICD-10-CM | POA: Diagnosis not present

## 2017-11-24 LAB — POCT GLYCOSYLATED HEMOGLOBIN (HGB A1C): Hemoglobin A1C: 7.5

## 2017-11-24 NOTE — Patient Instructions (Addendum)
check your blood sugar 4 times a day: before the 3 meals, and at bedtime.  also check if you have symptoms of your blood sugar being too high or too low.  please keep a record of the readings and bring it to your next appointment here (or you can bring the meter itself).  You can write it on any piece of paper.  please call us sooner if your blood sugar goes below 70, or if you have a lot of readings over 200.  Please take these pump settings for now:  basal rate of 2.3 units/HR, 7 AM-10 PM, and 0.9 units/HR 10PM-7AM.   No mealtime bolus.  correction bolus (which some people call "sensitivity," or "insulin sensitivity ratio," or just "isr") of 1 unit for each 25 by which your glucose exceeds 100.  For activity, suspend pump x 1-2 hrs. Please come back for a follow-up appointment in 3 months.

## 2017-11-24 NOTE — Progress Notes (Signed)
Subjective:    Patient ID: Bradley Berger, male    DOB: [birth year], 66 y.o.   MRN: [id number]  HPI Pt returns for f/u of diabetes mellitus: DM type: Insulin-requiring type 2 Dx'ed: 5093 Complications: polyneuropathy and CAD Therapy: insulin since 1999 DKA: never Severe hypoglycemia: never Pancreatitis: never Other: he uses a medtronic paradigm pump; we stopped mealtime boluses, as pt was forgetting them.   Interval history:  He takes these pump settings:   basal rate of 2.3 units/HR, 7 AM-10 PM, and 0.8 units/HR 10PM-7AM.   No mealtime bolus.  correction bolus (which some people call "sensitivity," or "insulin sensitivity ratio," or just "isr") of 1 unit for each 25 by which your glucose exceeds 100.  For activity, suspends pump x 1-2 hrs. 2 mos ago, he had steroids for bell's palsy.  cbg's were high, so pt increased basal to 1.25 units/hr at night, and 2.25 during the day.  no cbg record, but states cbg's are mildly low fasting.  It is in general higher as the day goes on Past Medical History:  Diagnosis Date  . Diabetes ([place]) 1997   Last A1C 6.2  . HTN (hypertension)   . Meningioma ([place])   . Parkinson disease ([place])   . S/P aortic valve replacement   . Sudden hearing loss    L ear    Past Surgical History:  Procedure Laterality Date  . BACK SURGERY    . CARDIAC SURGERY    . CATARACT EXTRACTION, BILATERAL    . HEMORROIDECTOMY    . KNEE SURGERY Left     Social History   Socioeconomic History  . Marital status: Married    Spouse name: Not on file  . Number of children: Not on file  . Years of education: Not on file  . Highest education level: Not on file  Social Needs  . Financial resource strain: Not on file  . Food insecurity - worry: Not on file  . Food insecurity - inability: Not on file  . Transportation needs - medical: Not on file  . Transportation needs - non-medical: Not on file  Occupational History  . Not on file  Tobacco Use  . Smoking status:  Never Smoker  . Smokeless tobacco: Never Used  Substance and Sexual Activity  . Alcohol use: Yes    Alcohol/week: 0.0 oz    Comment: very rare  . Drug use: No  . Sexual activity: Not on file  Other Topics Concern  . Not on file  Social History Narrative  . Not on file    Current Outpatient Medications on File Prior to Visit  Medication Sig Dispense Refill  . artificial tears (LACRILUBE) OINT ophthalmic ointment Place into the right eye every 3 (three) hours as needed for dry eyes. 1 g 0  . aspirin EC 81 MG tablet Take 81 mg by mouth daily.     . Cholecalciferol (VITAMIN D3) 5000 units CAPS Take 5,000 Units by mouth every morning.     . famotidine (PEPCID) 10 MG tablet Take 10 mg by mouth every morning.     . gabapentin (NEURONTIN) 600 MG tablet Take by mouth.    . insulin glulisine (APIDRA) 100 UNIT/ML injection For use in pump, for a total of 60 units/day Dx code E08.00 60 mL 3  . Insulin Infusion Pump Supplies (MINIMED INFUSION SET-MMT 397) MISC 1 Device by Does not apply route every 3 (three) days.    . Insulin Infusion Pump Supplies (MINIMED RESERVOIR 3ML)  MISC 1 Device by Does not apply route every 3 (three) days.    Marland Kitchen lisinopril (PRINIVIL,ZESTRIL) 2.5 MG tablet Take by mouth.    . loratadine (CLARITIN) 10 MG tablet Take by mouth.    . metoprolol tartrate (LOPRESSOR) 25 MG tablet Take by mouth 2 (two) times daily.     . Multiple Vitamin (MULTIVITAMIN) tablet Take 1 tablet by mouth daily.    . predniSONE (DELTASONE) 50 MG tablet One tablet PO daily for 6 days Please check your sugar frequently when on this medicine 6 tablet 0  . ranitidine (ZANTAC) 150 MG tablet Take by mouth.    . warfarin (COUMADIN) 4 MG tablet Take by mouth.     No current facility-administered medications on file prior to visit.     Allergies  Allergen Reactions  . Dilaudid [Hydromorphone Hcl] Other (See Comments)    "flushing"  . Morphine Other (See Comments)    "flushing"    Family History    Problem Relation Age of Onset  . Lung cancer Mother     BP 138/70 (BP Location: Left Arm, Patient Position: Sitting, Cuff Size: Normal)   Pulse 64   Wt 249 lb 6.4 oz (113.1 kg)   SpO2 94%   BMI 35.79 kg/m    Review of Systems Denies LOC    Objective:   Physical Exam VITAL SIGNS:  See vs page GENERAL: no distress Pulses: foot pulses are intact bilaterally.   MSK: no deformity of the feet or ankles.  CV: 1+ bilat edema of the legs, and bilat vv's.  Skin:  no ulcer on the feet or ankles.  normal color and temp on the feet and ankles.  Neuro: sensation is intact to touch on the feet and ankles.   A1c=7.5%.   Lab Results  Component Value Date   CREATININE 1.00 09/07/2017   BUN 20 09/07/2017   NA 140 09/07/2017   K 4.1 09/07/2017   CL 103 09/07/2017   CO2 23 09/07/2017       Assessment & Plan:  Insulin-requiring type 2 DM, with CAD: he needs increased rx, if it can be done with a regimen that avoids or minimizes hypoglycemia. Bell's palsy, new to me: steroids are affecting a1c   Patient Instructions  check your blood sugar 4 times a day: before the 3 meals, and at bedtime.  also check if you have symptoms of your blood sugar being too high or too low.  please keep a record of the readings and bring it to your next appointment here (or you can bring the meter itself).  You can write it on any piece of paper.  please call us sooner if your blood sugar goes below 70, or if you have a lot of readings over 200.  Please take these pump settings for now:  basal rate of 2.3 units/HR, 7 AM-10 PM, and 0.9 units/HR 10PM-7AM.   No mealtime bolus.  correction bolus (which some people call "sensitivity," or "insulin sensitivity ratio," or just "isr") of 1 unit for each 25 by which your glucose exceeds 100.  For activity, suspend pump x 1-2 hrs. Please come back for a follow-up appointment in 3 months.

## 2017-11-29 DIAGNOSIS — G4700 Insomnia, unspecified: Secondary | ICD-10-CM | POA: Diagnosis not present

## 2017-11-29 DIAGNOSIS — Z8619 Personal history of other infectious and parasitic diseases: Secondary | ICD-10-CM | POA: Diagnosis not present

## 2017-11-29 DIAGNOSIS — K740 Hepatic fibrosis: Secondary | ICD-10-CM | POA: Diagnosis not present

## 2017-11-29 DIAGNOSIS — E669 Obesity, unspecified: Secondary | ICD-10-CM | POA: Diagnosis not present

## 2017-11-29 DIAGNOSIS — E119 Type 2 diabetes mellitus without complications: Secondary | ICD-10-CM | POA: Diagnosis not present

## 2017-11-29 DIAGNOSIS — Z794 Long term (current) use of insulin: Secondary | ICD-10-CM | POA: Diagnosis not present

## 2017-11-29 DIAGNOSIS — B182 Chronic viral hepatitis C: Secondary | ICD-10-CM | POA: Diagnosis not present

## 2017-11-29 DIAGNOSIS — Z7901 Long term (current) use of anticoagulants: Secondary | ICD-10-CM | POA: Diagnosis not present

## 2017-11-29 DIAGNOSIS — Z9641 Presence of insulin pump (external) (internal): Secondary | ICD-10-CM | POA: Diagnosis not present

## 2017-11-29 DIAGNOSIS — K760 Fatty (change of) liver, not elsewhere classified: Secondary | ICD-10-CM | POA: Diagnosis not present

## 2017-11-29 DIAGNOSIS — I10 Essential (primary) hypertension: Secondary | ICD-10-CM | POA: Diagnosis not present

## 2017-12-26 DIAGNOSIS — I872 Venous insufficiency (chronic) (peripheral): Secondary | ICD-10-CM | POA: Diagnosis not present

## 2017-12-26 DIAGNOSIS — I10 Essential (primary) hypertension: Secondary | ICD-10-CM | POA: Diagnosis not present

## 2017-12-26 DIAGNOSIS — Z7901 Long term (current) use of anticoagulants: Secondary | ICD-10-CM | POA: Diagnosis not present

## 2017-12-26 DIAGNOSIS — E119 Type 2 diabetes mellitus without complications: Secondary | ICD-10-CM | POA: Diagnosis not present

## 2017-12-26 DIAGNOSIS — G20 Parkinson's disease: Secondary | ICD-10-CM | POA: Diagnosis not present

## 2017-12-26 DIAGNOSIS — I2510 Atherosclerotic heart disease of native coronary artery without angina pectoris: Secondary | ICD-10-CM | POA: Diagnosis not present

## 2017-12-26 DIAGNOSIS — Z13220 Encounter for screening for lipoid disorders: Secondary | ICD-10-CM | POA: Diagnosis not present

## 2017-12-26 DIAGNOSIS — T161XXA Foreign body in right ear, initial encounter: Secondary | ICD-10-CM | POA: Diagnosis not present

## 2017-12-26 DIAGNOSIS — I359 Nonrheumatic aortic valve disorder, unspecified: Secondary | ICD-10-CM | POA: Diagnosis not present

## 2018-01-04 DIAGNOSIS — J0100 Acute maxillary sinusitis, unspecified: Secondary | ICD-10-CM | POA: Diagnosis not present

## 2018-01-25 DIAGNOSIS — I359 Nonrheumatic aortic valve disorder, unspecified: Secondary | ICD-10-CM | POA: Diagnosis not present

## 2018-01-25 DIAGNOSIS — I2510 Atherosclerotic heart disease of native coronary artery without angina pectoris: Secondary | ICD-10-CM | POA: Diagnosis not present

## 2018-01-25 DIAGNOSIS — Z7901 Long term (current) use of anticoagulants: Secondary | ICD-10-CM | POA: Diagnosis not present

## 2018-01-25 DIAGNOSIS — I10 Essential (primary) hypertension: Secondary | ICD-10-CM | POA: Diagnosis not present

## 2018-01-25 DIAGNOSIS — E119 Type 2 diabetes mellitus without complications: Secondary | ICD-10-CM | POA: Diagnosis not present

## 2018-01-25 DIAGNOSIS — I872 Venous insufficiency (chronic) (peripheral): Secondary | ICD-10-CM | POA: Diagnosis not present

## 2018-02-02 ENCOUNTER — Telehealth: Payer: Self-pay | Admitting: Endocrinology

## 2018-02-02 DIAGNOSIS — Z794 Long term (current) use of insulin: Principal | ICD-10-CM

## 2018-02-02 DIAGNOSIS — E0800 Diabetes mellitus due to underlying condition with hyperosmolarity without nonketotic hyperglycemic-hyperosmolar coma (NKHHC): Secondary | ICD-10-CM

## 2018-02-02 NOTE — Telephone Encounter (Signed)
I called patient & he is about to run out of supplies in about 15 days. [employer] paperwork is requiring a fasting c-peptide. Patient needs to have that drawn in our office next week when lab is put in. I also have paperwork to be signed for [employer].

## 2018-02-02 NOTE — Telephone Encounter (Signed)
Patient is calling on the status of a form from [employer] for medical supplies faxed over, he want to know have you reviewed it. He need new order for his medical supply's, they also need last labs results and office notes.  He asked for a call back p# [phone]

## 2018-02-03 NOTE — Telephone Encounter (Signed)
I have made patient lab appt & he also rescheduled Monday morning at 9 am since he would be in office already.

## 2018-02-03 NOTE — Telephone Encounter (Signed)
I ordered.

## 2018-02-06 ENCOUNTER — Encounter: Payer: Self-pay | Admitting: Endocrinology

## 2018-02-06 ENCOUNTER — Ambulatory Visit (INDEPENDENT_AMBULATORY_CARE_PROVIDER_SITE_OTHER): Payer: Medicare Other | Admitting: Endocrinology

## 2018-02-06 ENCOUNTER — Other Ambulatory Visit (INDEPENDENT_AMBULATORY_CARE_PROVIDER_SITE_OTHER): Payer: Medicare Other

## 2018-02-06 VITALS — BP 134/68 | HR 94 | Ht 70.0 in | Wt 246.8 lb

## 2018-02-06 DIAGNOSIS — E0800 Diabetes mellitus due to underlying condition with hyperosmolarity without nonketotic hyperglycemic-hyperosmolar coma (NKHHC): Secondary | ICD-10-CM

## 2018-02-06 DIAGNOSIS — Z794 Long term (current) use of insulin: Secondary | ICD-10-CM | POA: Diagnosis not present

## 2018-02-06 LAB — BASIC METABOLIC PANEL
BUN: 16 mg/dL (ref 6–23)
CALCIUM: 9.5 mg/dL (ref 8.4–10.5)
CO2: 29 mEq/L (ref 19–32)
CREATININE: 1.03 mg/dL (ref 0.40–1.50)
Chloride: 104 mEq/L (ref 96–112)
GFR: 76.26 mL/min (ref >60.00)
GLUCOSE: 163 mg/dL — AB (ref 70–99)
Potassium: 4.6 mEq/L (ref 3.5–5.1)
Sodium: 140 mEq/L (ref 135–145)

## 2018-02-06 LAB — POCT GLYCOSYLATED HEMOGLOBIN (HGB A1C): HEMOGLOBIN A1C: 7.1

## 2018-02-06 NOTE — Progress Notes (Signed)
   Subjective:    Patient ID: Bradley Berger, male    DOB: 08-08-1951, 67 y.o.   MRN: [id number]  HPI    Review of Systems     Objective:   Physical Exam        Assessment & Plan:

## 2018-02-06 NOTE — Progress Notes (Signed)
Subjective:    Patient ID: Bradley Berger, male    DOB: Nov 10, 1951, 67 y.o.   MRN: [id number]  HPI Pt returns for f/u of diabetes mellitus: DM type: Insulin-requiring type 2 Dx'ed: 3557 Complications: polyneuropathy and CAD Therapy: insulin since 1999 DKA: never Severe hypoglycemia: never Pancreatitis: never Other: he uses a medtronic paradigm pump; we stopped mealtime boluses, as pt was forgetting them.   Interval history:  He takes these pump settings:   asal rate of 2.25 units/HR, 7 AM-10 PM, and 1.25 units/HR 10PM-7AM.   No mealtime bolus.  correction bolus (which some people call "sensitivity," or "insulin sensitivity ratio," or just "isr") of 1 unit for each 25 by which your glucose exceeds 100.  For activity, suspend pump x 1-2 hrs.  no cbg record, but states cbg's vary from 92-203.  It is in general higher as the day goes on, except when he is active.  pt states he feels well in general.   Past Medical History:  Diagnosis Date  . Diabetes ([place]) 1997   Last A1C 6.2  . HTN (hypertension)   . Meningioma ([place])   . Parkinson disease ([place])   . S/P aortic valve replacement   . Sudden hearing loss    L ear    Past Surgical History:  Procedure Laterality Date  . BACK SURGERY    . CARDIAC SURGERY    . CATARACT EXTRACTION, BILATERAL    . HEMORROIDECTOMY    . KNEE SURGERY Left     Social History   Socioeconomic History  . Marital status: Married    Spouse name: Not on file  . Number of children: Not on file  . Years of education: Not on file  . Highest education level: Not on file  Social Needs  . Financial resource strain: Not on file  . Food insecurity - worry: Not on file  . Food insecurity - inability: Not on file  . Transportation needs - medical: Not on file  . Transportation needs - non-medical: Not on file  Occupational History  . Not on file  Tobacco Use  . Smoking status: Never Smoker  . Smokeless tobacco: Never Used  Substance and Sexual Activity    . Alcohol use: Yes    Alcohol/week: 0.0 oz    Comment: very rare  . Drug use: No  . Sexual activity: Not on file  Other Topics Concern  . Not on file  Social History Narrative  . Not on file    Current Outpatient Medications on File Prior to Visit  Medication Sig Dispense Refill  . artificial tears (LACRILUBE) OINT ophthalmic ointment Place into the right eye every 3 (three) hours as needed for dry eyes. 1 g 0  . aspirin EC 81 MG tablet Take 81 mg by mouth daily.     . Cholecalciferol (VITAMIN D3) 5000 units CAPS Take 5,000 Units by mouth every morning.     . famotidine (PEPCID) 10 MG tablet Take 10 mg by mouth every morning.     . gabapentin (NEURONTIN) 600 MG tablet Take by mouth.    . insulin glulisine (APIDRA) 100 UNIT/ML injection For use in pump, for a total of 60 units/day Dx code E08.00 60 mL 3  . Insulin Infusion Pump Supplies (MINIMED INFUSION SET-MMT 397) MISC 1 Device by Does not apply route every 3 (three) days.    . Insulin Infusion Pump Supplies (MINIMED RESERVOIR 3ML) MISC 1 Device by Does not apply route every 3 (three) days.    Marland Kitchen  lisinopril (PRINIVIL,ZESTRIL) 2.5 MG tablet Take by mouth.    . loratadine (CLARITIN) 10 MG tablet Take by mouth.    . metoprolol tartrate (LOPRESSOR) 25 MG tablet Take by mouth 2 (two) times daily.     . Multiple Vitamin (MULTIVITAMIN) tablet Take 1 tablet by mouth daily.    . ranitidine (ZANTAC) 150 MG tablet Take by mouth.    . warfarin (COUMADIN) 4 MG tablet Take by mouth.     No current facility-administered medications on file prior to visit.     Allergies  Allergen Reactions  . Dilaudid [Hydromorphone Hcl] Other (See Comments)    "flushing"  . Morphine Other (See Comments)    "flushing"    Family History  Problem Relation Age of Onset  . Lung cancer Mother     BP 134/68   Pulse 94   Ht 5\' 10"  (1.778 m)   Wt 246 lb 12.8 oz (111.9 kg)   SpO2 98%   BMI 35.41 kg/m   Review of Systems He denies hypoglycemia.       Objective:   Physical Exam VITAL SIGNS:  See vs page GENERAL: no distress Pulses: foot pulses are intact bilaterally.   MSK: no deformity of the feet or ankles.  CV: 1+ bilat edema of the legs, and bilat vv's.  Skin:  no ulcer on the feet or ankles.  normal color and temp on the feet and ankles.  Neuro: sensation is intact to touch on the feet and ankles.  Ext: There is bilateral onychomycosis of the toenails.    Lab Results  Component Value Date   HGBA1C 7.1 02/06/2018       Assessment & Plan:  Insulin-requiring type 2 DM, with CAD: The pattern of his cbg's indicates he needs some adjustment in his therapy.   Patient Instructions  check your blood sugar 4 times a day: before the 3 meals, and at bedtime.  also check if you have symptoms of your blood sugar being too high or too low.  please keep a record of the readings and bring it to your next appointment here (or you can bring the meter itself).  You can write it on any piece of paper.  please call us sooner if your blood sugar goes below 70, or if you have a lot of readings over 200.  Please take these pump settings for now:  basal rate of 2.3 units/HR, 7 AM-10 PM, and 1.2 units/HR 10PM-7AM.   No mealtime bolus.  correction bolus (which some people call "sensitivity," or "insulin sensitivity ratio," or just "isr") of 1 unit for each 25 by which your glucose exceeds 100.  For activity, suspend pump x 1-2 hrs.  Please come back for a follow-up appointment in 3 months.

## 2018-02-06 NOTE — Patient Instructions (Addendum)
check your blood sugar 4 times a day: before the 3 meals, and at bedtime.  also check if you have symptoms of your blood sugar being too high or too low.  please keep a record of the readings and bring it to your next appointment here (or you can bring the meter itself).  You can write it on any piece of paper.  please call us sooner if your blood sugar goes below 70, or if you have a lot of readings over 200.  Please take these pump settings for now:  basal rate of 2.3 units/HR, 7 AM-10 PM, and 1.2 units/HR 10PM-7AM.   No mealtime bolus.  correction bolus (which some people call "sensitivity," or "insulin sensitivity ratio," or just "isr") of 1 unit for each 25 by which your glucose exceeds 100.  For activity, suspend pump x 1-2 hrs.  Please come back for a follow-up appointment in 3 months.

## 2018-02-07 LAB — C-PEPTIDE: C PEPTIDE: 0.83 ng/mL (ref 0.80–3.85)

## 2018-02-14 NOTE — Progress Notes (Signed)
Subjective:   Bradley Berger was seen in consultation in the movement disorder clinic at the request of [name], [name], MD.  The evaluation is for tremor.  The patient has previously seen a neurologist but I don't have any of those records.  I did review records from his PCP.    This patient is accompanied in the office by his spouse who supplements the history.   The patient is a 67 y.o. right handed male with a history of tremor.  Tremor has been present for 1-1.5 years and is located in both hands, but the right seems worse.  It is intermittent.  It is worse when BS is low.  He saw the neurologist in [place] neurologist about a year ago and was started on primidone and he took that for about 3 months.  He was told he needed to d/c that when he started the [facility] and he is now disease free.  He is on gabapentin 600 mg but that is for neuropathy and takes that q hs.  There is no family hx of tremor.  Pt admits that he had a friend who came here who was previously dx with "tremor" and ultimately had PD.    Affected by caffeine:  No. (doesn't drink coffee) Affected by alcohol:  unknown (only drinks 12 pack beer per year) Affected by stress:  Yes.   Affected by fatigue:  Yes.   Spills soup if on spoon:  no Spills glass of liquid if full:  No. Affects ADL's (tying shoes, brushing teeth, etc):  No.  Current/Previously tried tremor medications: primidone (taken off due to starting harvoni for hep c)  Current medications that may exacerbate tremor:  n/a  Outside reports reviewed: historical medical records and office notes.  02/07/17 update:  Patient follows up today, accompanied by his wife who supplements the history.  Patient has a history of mild resting tremor and I last saw him in July, 2017.  The patient states that it has stayed the same but wife states that it has gotten a little worse.  Most noticeable when holding a book or if tired.  He is R hand dominant and R hand may  shake more than the L.  No falls.  Not exercising to any significant degree.  Takes 30 min nap in afternoon.  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  No records are available.  With one of the vertigo epsiodes, he had a syncopal episode.   He went to the hospital and was dx with "a small brain tumor" per wife.  I was able to obtain this per wife and he was dx with a paraclinoid meningioma.  Hearing loss started to return and was uncomfortable as he heard loud "static" in the ear.  That is better but hearing regressed again.  08/18/17 update:  Patient seen today in follow-up for his tremor.  He is accompanied by his wife who supplements the history.  His tremor has been stable per patient but wife states its a little worse.  Pt will notice it when fatigued.  No falls.  He is doing water aerobics for exercise.  MRI was done in March, 2018 to evaluate his known meningioma.  It is in close proximity to the right ICA terminus.  Because of this,  he was sent for an evaluation with Dr. Vertell Limber.  I received a note from him dated 03/28/2017.  They're just going to follow this and repeat the scan in one year per his notes.  The patient states, however, that he has an appointment with him in October and thinks he has a repeat scan before that.  02/16/18 update: Patient is following up today for his tremor.  He is accompanied by his daughter who supplements the history.  He has been exercising "when I can."  He has exercised less over the last month (was 2-3 days per week).    He has had no falls.  No lightheadedness or near syncope.  Tremor, at times, is worse and sometimes it is better.  Daughter notes its worse with fatigue.  Records have been reviewed since last visit.  He has seen Dr. Loanne Drilling for his diabetes.  He was in the emergency room in September for Bell's palsy.  It lasted  about 2 weeks.  Allergies  Allergen Reactions  . Dilaudid [Hydromorphone Hcl] Other (See Comments)    "flushing"  . Morphine Other (See Comments)    "flushing"    Outpatient Encounter Medications as of 02/16/2018  Medication Sig  . artificial tears (LACRILUBE) OINT ophthalmic ointment Place into the right eye every 3 (three) hours as needed for dry eyes.  Marland Kitchen aspirin EC 81 MG tablet Take 81 mg by mouth daily.   . Cholecalciferol (VITAMIN D3) 5000 units CAPS Take 5,000 Units by mouth every morning.   . famotidine (PEPCID) 10 MG tablet Take 10 mg by mouth every morning.   . gabapentin (NEURONTIN) 600 MG tablet Take 600 mg by mouth daily.   . insulin glulisine (APIDRA) 100 UNIT/ML injection For use in pump, for a total of 60 units/day Dx code E08.00  . Insulin Infusion Pump Supplies (MINIMED INFUSION SET-MMT 397) MISC 1 Device by Does not apply route every 3 (three) days.  . Insulin Infusion Pump Supplies (MINIMED RESERVOIR 3ML) MISC 1 Device by Does not apply route every 3 (three) days.  Marland Kitchen lisinopril (PRINIVIL,ZESTRIL) 2.5 MG tablet Take by mouth.  . loratadine (CLARITIN) 10 MG tablet Take by mouth.  . metoprolol tartrate (LOPRESSOR) 25 MG tablet Take by mouth 2 (two) times daily.   . Multiple Vitamin (MULTIVITAMIN) tablet Take 1 tablet by mouth daily.  . ranitidine (ZANTAC) 150 MG tablet Take by mouth.  . warfarin (COUMADIN) 4 MG tablet Take by mouth.   No facility-administered encounter medications on file as of 02/16/2018.     Past Medical History:  Diagnosis Date  . Diabetes ([place]) 1997   Last A1C 6.2  . HTN (hypertension)   . Meningioma ([place])   . Parkinson disease ([place])   . S/P aortic valve replacement   . Sudden hearing loss    L ear    Past Surgical History:  Procedure Laterality Date  . BACK SURGERY    . CARDIAC SURGERY    . CATARACT EXTRACTION, BILATERAL    . HEMORROIDECTOMY    . KNEE SURGERY Left     Social History   Socioeconomic History  . Marital status:  Married    Spouse name: Not on file  . Number of children: Not on file  . Years of education: Not on file  . Highest education level: Not on file  Social Needs  . Financial resource strain: Not on file  . Food insecurity - worry: Not on file  . Food insecurity - inability: Not  on file  . Transportation needs - medical: Not on file  . Transportation needs - non-medical: Not on file  Occupational History  . Not on file  Tobacco Use  . Smoking status: Never Smoker  . Smokeless tobacco: Never Used  Substance and Sexual Activity  . Alcohol use: Yes    Alcohol/week: 0.0 oz    Comment: very rare  . Drug use: No  . Sexual activity: Not on file  Other Topics Concern  . Not on file  Social History Narrative  . Not on file    Family Status  Relation Name Status  . Mother  Deceased  . Father  Deceased       accident  . Sister  Alive  . Sister  Alive    Review of Systems A complete 10 system ROS was obtained and was negative apart from what is mentioned.   Objective:   VITALS:   Vitals:   02/16/18 1029  BP: 108/70  Pulse: 60  SpO2: 98%  Weight: 243 lb (110.2 kg)  Height: 5\' 10"  (1.778 m)    Gen:  Appears stated age and in NAD. HEENT:  Normocephalic, atraumatic. The mucous membranes are moist. The superficial temporal arteries are without ropiness or tenderness. Cardiovascular: Regular rate and rhythm. Lungs: Clear to auscultation bilaterally. Neck: There are no carotid bruits noted bilaterally.  NEUROLOGICAL:  Orientation:  The patient is alert and oriented x 3.   Cranial nerves: There is good facial symmetry.  Extraocular muscles are intact and visual fields are full to confrontational testing. Speech is fluent and clear. Soft palate rises symmetrically and there is no tongue deviation. Hearing is intact to conversational tone. Sensation: Sensation is intact to light touch throughout Motor: Strength is 5/5 in the bilateral upper and lower extremities.  Shoulder  shrug is equal bilaterally.  There is no pronator drift.  There are no fasciculations noted.  Movement examination: Tone: There is normal tone in the RUE.  There is normal tone in the left upper extremity today.  There is mild increase in tone in the left lower extremity.  Tone is normal in the right lower extremity. Abnormal movements: There is a very mild right upper extremity resting tremor that only slightly increases with distraction.  I have a saw thumb tremor on the left and it was very rare. Coordination:  There is  decremation with RAM's, with finger taps on the L and alternation of supination/pronation of the forearm on the L Gait and Station:   The patient arises easily out of the chair without the use of his hands.  He has good arm swing.   LABS  TSH done 03/24/16 2.83  Lab Results  Component Value Date   HGBA1C 7.1 02/06/2018        Assessment/Plan:   1.  Tremor  -The patient still does not meet any criteria for idiopathic Parkinson's disease.  I would like to continue to monitor him.  He actually looks better today than he did last visit.  He and I discussed the importance of cutting back in regular exercise, particularly cardiovascular exercise.  2.  L foot drop  -probably due to L5-S1 radiculopathy and he is s/p surgical intervention and no longer feels pain.    3.  Diabetic peripheral neuropathy  -talked about safety.  Is the likely etiology for balance issues.    4.  Paracliniod meningioma  -He is now following with Dr. Vertell Limber.  He is supposed to have a repeat  MRI with Dr. Vertell Limber one year from April, 2018 per Dr. Donald Pore reports.  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  This has not become dangerous.  We talked about the association with Parkinson's disease.  6.  Follow-up with me will be in the next year since he is doing well.  He was told to call me should he experience any new neurologic symptoms.

## 2018-02-16 ENCOUNTER — Ambulatory Visit (INDEPENDENT_AMBULATORY_CARE_PROVIDER_SITE_OTHER): Payer: Medicare Other | Admitting: Neurology

## 2018-02-16 ENCOUNTER — Encounter: Payer: Self-pay | Admitting: Neurology

## 2018-02-16 VITALS — BP 108/70 | HR 60 | Ht 70.0 in | Wt 243.0 lb

## 2018-02-16 DIAGNOSIS — R251 Tremor, unspecified: Secondary | ICD-10-CM

## 2018-02-22 ENCOUNTER — Telehealth: Payer: Self-pay | Admitting: Nutrition

## 2018-02-22 ENCOUNTER — Ambulatory Visit: Payer: PRIVATE HEALTH INSURANCE | Admitting: Endocrinology

## 2018-02-22 NOTE — Telephone Encounter (Signed)
Message left on my machine that [employer] is not billing Medicare for supplies for his insulin pump. I phoned her and gave the name of [place] to use a a supplier of [employer] pump supplies.  She will call them to set up an account and have them send [place] a form to get his pump supplies shipped to him.  She reported good understanding of this.

## 2018-02-23 DIAGNOSIS — I872 Venous insufficiency (chronic) (peripheral): Secondary | ICD-10-CM | POA: Diagnosis not present

## 2018-02-23 DIAGNOSIS — E119 Type 2 diabetes mellitus without complications: Secondary | ICD-10-CM | POA: Diagnosis not present

## 2018-02-23 DIAGNOSIS — Z7901 Long term (current) use of anticoagulants: Secondary | ICD-10-CM | POA: Diagnosis not present

## 2018-02-23 DIAGNOSIS — I359 Nonrheumatic aortic valve disorder, unspecified: Secondary | ICD-10-CM | POA: Diagnosis not present

## 2018-02-23 DIAGNOSIS — I10 Essential (primary) hypertension: Secondary | ICD-10-CM | POA: Diagnosis not present

## 2018-02-23 DIAGNOSIS — I2510 Atherosclerotic heart disease of native coronary artery without angina pectoris: Secondary | ICD-10-CM | POA: Diagnosis not present

## 2018-03-27 ENCOUNTER — Telehealth: Payer: Self-pay | Admitting: Endocrinology

## 2018-03-27 DIAGNOSIS — I872 Venous insufficiency (chronic) (peripheral): Secondary | ICD-10-CM | POA: Diagnosis not present

## 2018-03-27 DIAGNOSIS — I2510 Atherosclerotic heart disease of native coronary artery without angina pectoris: Secondary | ICD-10-CM | POA: Diagnosis not present

## 2018-03-27 DIAGNOSIS — I359 Nonrheumatic aortic valve disorder, unspecified: Secondary | ICD-10-CM | POA: Diagnosis not present

## 2018-03-27 DIAGNOSIS — E119 Type 2 diabetes mellitus without complications: Secondary | ICD-10-CM | POA: Diagnosis not present

## 2018-03-27 DIAGNOSIS — Z7901 Long term (current) use of anticoagulants: Secondary | ICD-10-CM | POA: Diagnosis not present

## 2018-03-27 DIAGNOSIS — I10 Essential (primary) hypertension: Secondary | ICD-10-CM | POA: Diagnosis not present

## 2018-03-27 NOTE — Telephone Encounter (Signed)
Ok, I need to know what brand of meter, or do you need a new meter?

## 2018-03-27 NOTE — Telephone Encounter (Signed)
Pt stated that he will start checking 4 times a day beginning tomorrow but he stated that he will need more supplies to cover the extra testing.

## 2018-03-27 NOTE — Telephone Encounter (Signed)
please call patient: I am working to fill out form for your pump supplies.  They need a record of your blood sugar checked 4 times per day.  Do you have this?  If not, please start writing dowm, and send to [place].

## 2018-03-28 ENCOUNTER — Other Ambulatory Visit: Payer: Self-pay

## 2018-03-28 NOTE — Telephone Encounter (Signed)
This is just an [place]. I spoke with patient & she stated that since he lives in [place] it will be a couple weeks before he gets here with blood sugar log. He also is running low on infusion sets so he is going to get a couple boxes we have when he comes by. Luckily patient stated that he has 8 boxes of test strips for the contour next, which is what he uses with his pump. He also stated that when he sees [name] in May that he wanted to possibly discuss a new pump since the 5 year warranty ran out on Jan. 1 on his current medtronic paradigm.

## 2018-04-04 ENCOUNTER — Telehealth: Payer: Self-pay | Admitting: Endocrinology

## 2018-04-04 NOTE — Telephone Encounter (Signed)
Patient would like a call back to discuss information needed for pump for [employer]. And information that was faxed over.  Please advise

## 2018-04-04 NOTE — Telephone Encounter (Signed)
Ok, i'll come in soon to do that.

## 2018-04-04 NOTE — Telephone Encounter (Signed)
-----   Message from Dorna Leitz, [place] sent at 04/03/2018  3:39 PM EDT ----- Regarding: CBG's I see where a couple weeks ago you were working on paperwork for patient's pump supplies. He needed to start checking at least 4 times a day & keep that log. He has brought that in so work can be completed. I have put cbg's on your desk fore you to view when you return. I also gave patient a couple boxes of pump supplies to hopefully get him through.

## 2018-04-05 NOTE — Telephone Encounter (Signed)
I have called patient & moved up his appointment to 4/29. He needs labs for [place] paperwork for pump supplies. I have also rescheduled his appointment with [name] to discuss new pump.

## 2018-04-06 DIAGNOSIS — L821 Other seborrheic keratosis: Secondary | ICD-10-CM | POA: Diagnosis not present

## 2018-04-06 DIAGNOSIS — L814 Other melanin hyperpigmentation: Secondary | ICD-10-CM | POA: Diagnosis not present

## 2018-04-06 DIAGNOSIS — Z1283 Encounter for screening for malignant neoplasm of skin: Secondary | ICD-10-CM | POA: Diagnosis not present

## 2018-04-06 DIAGNOSIS — L720 Epidermal cyst: Secondary | ICD-10-CM | POA: Diagnosis not present

## 2018-04-06 DIAGNOSIS — D235 Other benign neoplasm of skin of trunk: Secondary | ICD-10-CM | POA: Diagnosis not present

## 2018-04-06 DIAGNOSIS — B351 Tinea unguium: Secondary | ICD-10-CM | POA: Diagnosis not present

## 2018-04-06 DIAGNOSIS — L573 Poikiloderma of Civatte: Secondary | ICD-10-CM | POA: Diagnosis not present

## 2018-04-10 ENCOUNTER — Ambulatory Visit (INDEPENDENT_AMBULATORY_CARE_PROVIDER_SITE_OTHER): Payer: Medicare Other | Admitting: Endocrinology

## 2018-04-10 ENCOUNTER — Encounter: Payer: Medicare Other | Attending: Endocrinology | Admitting: Nutrition

## 2018-04-10 ENCOUNTER — Encounter: Payer: Self-pay | Admitting: Endocrinology

## 2018-04-10 VITALS — BP 118/68 | HR 55 | Resp 16 | Wt 245.4 lb

## 2018-04-10 DIAGNOSIS — E119 Type 2 diabetes mellitus without complications: Secondary | ICD-10-CM | POA: Diagnosis not present

## 2018-04-10 DIAGNOSIS — Z794 Long term (current) use of insulin: Secondary | ICD-10-CM | POA: Diagnosis not present

## 2018-04-10 DIAGNOSIS — E0800 Diabetes mellitus due to underlying condition with hyperosmolarity without nonketotic hyperglycemic-hyperosmolar coma (NKHHC): Secondary | ICD-10-CM | POA: Diagnosis not present

## 2018-04-10 LAB — BASIC METABOLIC PANEL
BUN: 20 mg/dL (ref 6–23)
CALCIUM: 8.9 mg/dL (ref 8.4–10.5)
CHLORIDE: 104 meq/L (ref 96–112)
CO2: 30 mEq/L (ref 19–32)
CREATININE: 1.04 mg/dL (ref 0.40–1.50)
GFR: 75.38 mL/min (ref >60.00)
Glucose, Bld: 111 mg/dL — ABNORMAL HIGH (ref 70–99)
Potassium: 4.6 mEq/L (ref 3.5–5.1)
Sodium: 139 mEq/L (ref 135–145)

## 2018-04-10 LAB — HEMOGLOBIN A1C: HEMOGLOBIN A1C: 7 % — AB (ref 4.6–6.5)

## 2018-04-10 NOTE — Progress Notes (Signed)
Subjective:    Patient ID: Bradley Berger, male    DOB: 22-Feb-1951, 67 y.o.   MRN: [id number]  HPI Pt returns for f/u of diabetes mellitus: DM type: Insulin-requiring type 2 Dx'ed: 8546 Complications: polyneuropathy and CAD Therapy: insulin since 1999 DKA: never Severe hypoglycemia: never Pancreatitis: never Other: he uses a medtronic paradigm pump; we stopped mealtime boluses, as pt was forgetting them.   Interval history:  He takes these pump settings:   asal rate of 2.25 units/HR, 7 AM-10 PM, and 1.25 units/HR 10PM-7AM.   No mealtime bolus.  correction bolus (which some people call "sensitivity," or "insulin sensitivity ratio," or just "isr") of 1 unit for each 25 by which your glucose exceeds 100.  For activity, suspend pump x 1-2 hrs.  no cbg record, but states cbg's vary from 100-200.  There is no trend throughout the day.  pt states he feels well in general.   Past Medical History:  Diagnosis Date  . Diabetes ([place]) 1997   Last A1C 6.2  . HTN (hypertension)   . Meningioma ([place])   . Parkinson disease ([place])   . S/P aortic valve replacement   . Sudden hearing loss    L ear    Past Surgical History:  Procedure Laterality Date  . BACK SURGERY    . CARDIAC SURGERY    . CATARACT EXTRACTION, BILATERAL    . HEMORROIDECTOMY    . KNEE SURGERY Left     Social History   Socioeconomic History  . Marital status: Married    Spouse name: Not on file  . Number of children: Not on file  . Years of education: Not on file  . Highest education level: Not on file  Occupational History  . Not on file  Social Needs  . Financial resource strain: Not on file  . Food insecurity:    Worry: Not on file    Inability: Not on file  . Transportation needs:    Medical: Not on file    Non-medical: Not on file  Tobacco Use  . Smoking status: Never Smoker  . Smokeless tobacco: Never Used  Substance and Sexual Activity  . Alcohol use: Yes    Alcohol/week: 0.0 oz    Comment: very  rare  . Drug use: No  . Sexual activity: Not on file  Lifestyle  . Physical activity:    Days per week: Not on file    Minutes per session: Not on file  . Stress: Not on file  Relationships  . Social connections:    Talks on phone: Not on file    Gets together: Not on file    Attends religious service: Not on file    Active member of club or organization: Not on file    Attends meetings of clubs or organizations: Not on file    Relationship status: Not on file  . Intimate partner violence:    Fear of current or ex partner: Not on file    Emotionally abused: Not on file    Physically abused: Not on file    Forced sexual activity: Not on file  Other Topics Concern  . Not on file  Social History Narrative  . Not on file    Current Outpatient Medications on File Prior to Visit  Medication Sig Dispense Refill  . aspirin EC 81 MG tablet Take 81 mg by mouth daily.     . Cholecalciferol (VITAMIN D3) 5000 units CAPS Take 5,000 Units by mouth every  morning.     . famotidine (PEPCID) 10 MG tablet Take 10 mg by mouth every morning.     . gabapentin (NEURONTIN) 600 MG tablet Take 600 mg by mouth daily.     . insulin glulisine (APIDRA) 100 UNIT/ML injection For use in pump, for a total of 60 units/day Dx code E08.00 60 mL 3  . Insulin Infusion Pump Supplies (MINIMED INFUSION SET-MMT 397) MISC 1 Device by Does not apply route every 3 (three) days.    . Insulin Infusion Pump Supplies (MINIMED RESERVOIR 3ML) MISC 1 Device by Does not apply route every 3 (three) days.    Marland Kitchen lisinopril (PRINIVIL,ZESTRIL) 2.5 MG tablet Take by mouth.    . loratadine (CLARITIN) 10 MG tablet Take by mouth.    . metoprolol tartrate (LOPRESSOR) 25 MG tablet Take by mouth 2 (two) times daily.     . Multiple Vitamin (MULTIVITAMIN) tablet Take 1 tablet by mouth daily.    . ranitidine (ZANTAC) 150 MG tablet Take by mouth.    . warfarin (COUMADIN) 4 MG tablet Take by mouth.    Marland Kitchen artificial tears (LACRILUBE) OINT  ophthalmic ointment Place into the right eye every 3 (three) hours as needed for dry eyes. (Patient not taking: Reported on 04/10/2018) 1 g 0   No current facility-administered medications on file prior to visit.     Allergies  Allergen Reactions  . Dilaudid [Hydromorphone Hcl] Other (See Comments)    "flushing"  . Morphine Other (See Comments)    "flushing"    Family History  Problem Relation Age of Onset  . Lung cancer Mother     BP 118/68   Pulse (!) 55   Resp 16   Wt 245 lb 6.4 oz (111.3 kg)   SpO2 97%   BMI 35.21 kg/m    Review of Systems He denies hypoglycemia.      Objective:   Physical Exam VITAL SIGNS:  See vs page GENERAL: no distress Pulses: foot pulses are intact bilaterally.   MSK: no deformity of the feet or ankles.  CV: 2+ bilat edema of the legs, and bilat vv's.  Skin:  no ulcer on the feet or ankles.  normal color and temp on the feet and ankles.  Neuro: sensation is intact to touch on the feet and ankles.  Ext: There is bilateral onychomycosis of the toenails.    Lab Results  Component Value Date   HGBA1C 7.0 (H) 04/10/2018      Assessment & Plan:  Insulin-requiring type 2 DM, with CAD: this is the best control this pt should aim for, given this pump regimen, which does match insulin to his changing needs throughout the day.   Patient Instructions  check your blood sugar 4 times a day: before the 3 meals, and at bedtime.  also check if you have symptoms of your blood sugar being too high or too low.  please keep a record of the readings and bring it to your next appointment here (or you can bring the meter itself).  You can write it on any piece of paper.  please call us sooner if your blood sugar goes below 70, or if you have a lot of readings over 200.  Please continue these pump settings for now:  basal rate of 2.3 units/HR, 7 AM-10 PM, and 1.2 units/HR 10PM-7AM.   No mealtime bolus.  correction bolus (which some people call "sensitivity," or  "insulin sensitivity ratio," or just "isr") of 1 unit for each  25 by which your glucose exceeds 100.  For activity, suspend pump x 1-2 hrs. blood tests are requested for you today.  We'll let you know about the results.  Based on the results, I hope you qualify for the pump supplies.    Please come back for a follow-up appointment in 4 months.

## 2018-04-10 NOTE — Patient Instructions (Addendum)
check your blood sugar 4 times a day: before the 3 meals, and at bedtime.  also check if you have symptoms of your blood sugar being too high or too low.  please keep a record of the readings and bring it to your next appointment here (or you can bring the meter itself).  You can write it on any piece of paper.  please call us sooner if your blood sugar goes below 70, or if you have a lot of readings over 200.  Please continue these pump settings for now:  basal rate of 2.3 units/HR, 7 AM-10 PM, and 1.2 units/HR 10PM-7AM.   No mealtime bolus.  correction bolus (which some people call "sensitivity," or "insulin sensitivity ratio," or just "isr") of 1 unit for each 25 by which your glucose exceeds 100.  For activity, suspend pump x 1-2 hrs. blood tests are requested for you today.  We'll let you know about the results.  Based on the results, I hope you qualify for the pump supplies.    Please come back for a follow-up appointment in 4 months.

## 2018-04-11 NOTE — Patient Instructions (Signed)
Read over information given and call if questions 

## 2018-04-11 NOTE — Progress Notes (Signed)
Pt. Is here with his wife, and very upset that paperwork has not gone through with C-peptide test and FBS.  Insurance company called and all information has been received that is needed.  They did not need another C-peptide and FBS. We discussed pump therapy, and again he was given brochures to review, but did not want to "think about this now".

## 2018-05-02 ENCOUNTER — Ambulatory Visit: Payer: Medicare Other | Admitting: Nutrition

## 2018-05-02 ENCOUNTER — Ambulatory Visit: Payer: PRIVATE HEALTH INSURANCE | Admitting: Endocrinology

## 2018-05-05 ENCOUNTER — Ambulatory Visit: Payer: PRIVATE HEALTH INSURANCE | Admitting: Endocrinology

## 2018-05-11 DIAGNOSIS — C44621 Squamous cell carcinoma of skin of unspecified upper limb, including shoulder: Secondary | ICD-10-CM | POA: Diagnosis not present

## 2018-05-11 DIAGNOSIS — D485 Neoplasm of uncertain behavior of skin: Secondary | ICD-10-CM | POA: Diagnosis not present

## 2018-05-23 DIAGNOSIS — D329 Benign neoplasm of meninges, unspecified: Secondary | ICD-10-CM | POA: Diagnosis not present

## 2018-05-23 DIAGNOSIS — C44629 Squamous cell carcinoma of skin of left upper limb, including shoulder: Secondary | ICD-10-CM | POA: Diagnosis not present

## 2018-05-24 ENCOUNTER — Telehealth: Payer: Self-pay | Admitting: Endocrinology

## 2018-05-24 NOTE — Telephone Encounter (Signed)
[employer] is calling in regards to paperwork that was faxed over for labs.  Please advise  [phone]

## 2018-05-24 NOTE — Telephone Encounter (Signed)
I called [employer] & they stated that they needed last OV notes from 4/29. They needed these notes to state that patient is checking 4 times daily. This however isn't in your note & patient isn't seen again until 9/03. I see where in April he brought in CN+BG log to prove he was checking 4 times a day. I am unsure what happened then? They also need c-peptide lab & written on it someplace "fasting". [employer] is sending back over paper with requested info. I didn't know if an addendum needed to be created in note?

## 2018-05-25 DIAGNOSIS — K760 Fatty (change of) liver, not elsewhere classified: Secondary | ICD-10-CM | POA: Diagnosis not present

## 2018-05-25 DIAGNOSIS — B182 Chronic viral hepatitis C: Secondary | ICD-10-CM | POA: Diagnosis not present

## 2018-05-25 NOTE — Telephone Encounter (Signed)
First, do we have the logs with qid cbg checks? Then

## 2018-05-25 NOTE — Telephone Encounter (Signed)
Please print the result and write "fasting" on it.  Thank you.  Then, if we have the cbg log showing qid, cna you put that in the scanning pile, and addend this phone note saying he has sent [place] that?

## 2018-05-26 ENCOUNTER — Telehealth: Payer: Self-pay | Admitting: Endocrinology

## 2018-05-26 IMAGING — CT CT HEAD W/O CM
4 series · 16 of 47 positions shown, 18 images · non-contrast
Comparison: 03/06/2017 MRI

CLINICAL DATA: Right-sided facial numbness and droop that began
last evening.

EXAM:
CT HEAD WITHOUT CONTRAST
TECHNIQUE: Contiguous axial images were obtained from the base of the skull
through the vertex without intravenous contrast.

[Series 3: head wo · axial · 0.42mm/px · z∈[+6,+126]mm · 7 of 34 slices shown, 9 images]
[im 5/34  brain]
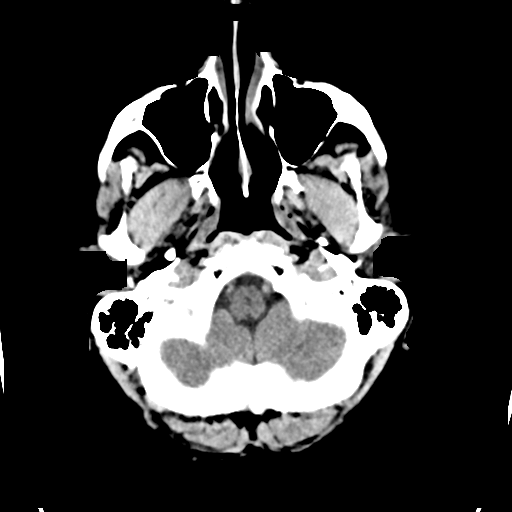
[im 5/34  bone]
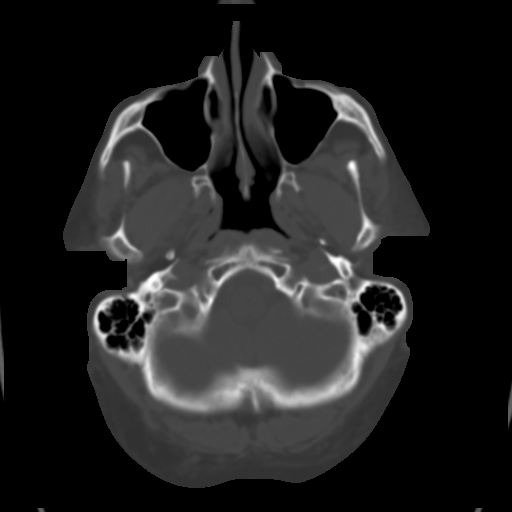
[im 9/34  brain]
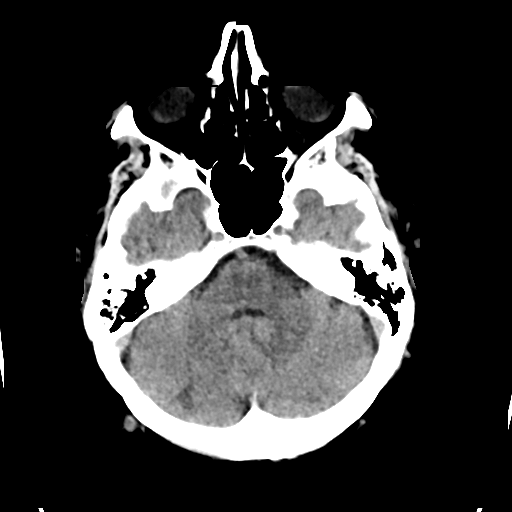
[im 13/34  brain]
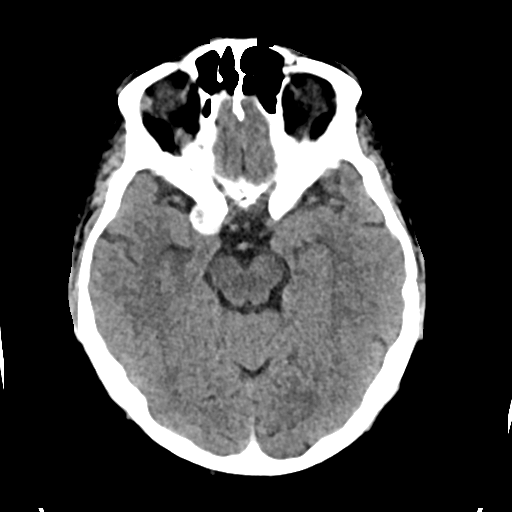
[im 17/34  brain]
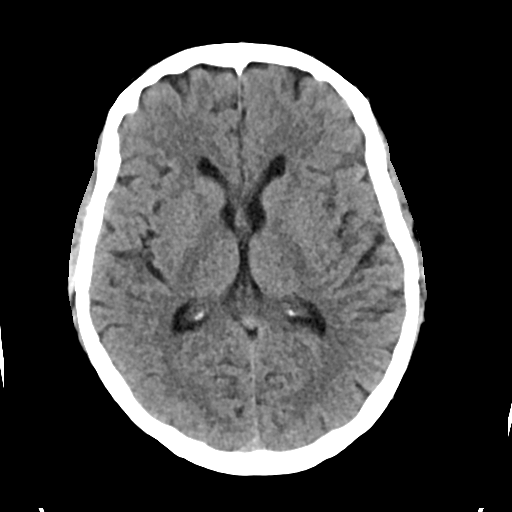
[im 21/34  brain]
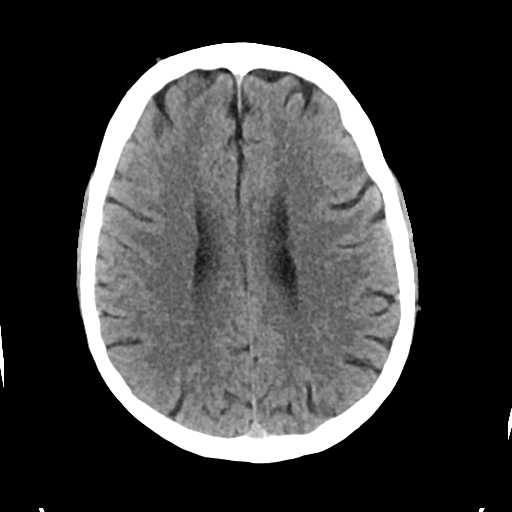
[im 21/34  bone]
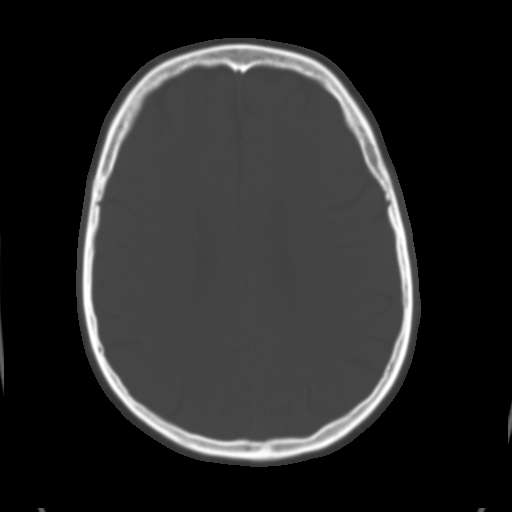
[im 25/34  brain]
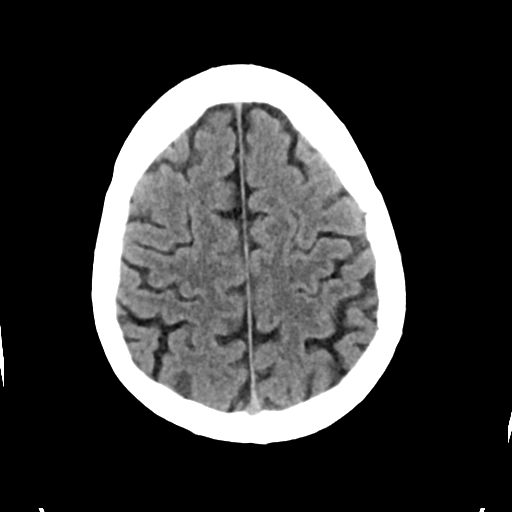
[im 29/34  brain]
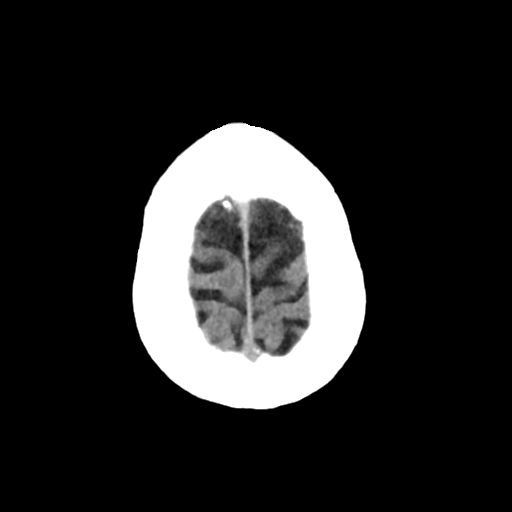

[Series 4: head bone · axial · 0.42mm/px · z∈[+2,+34]mm · 3 of 83 slices shown]
[im 9/83  bone]
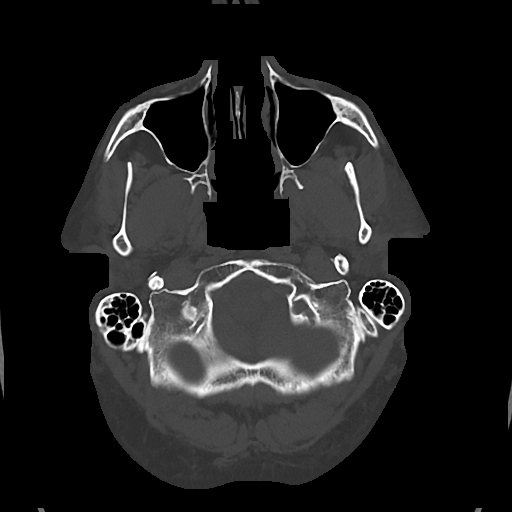
[im 17/83  bone]
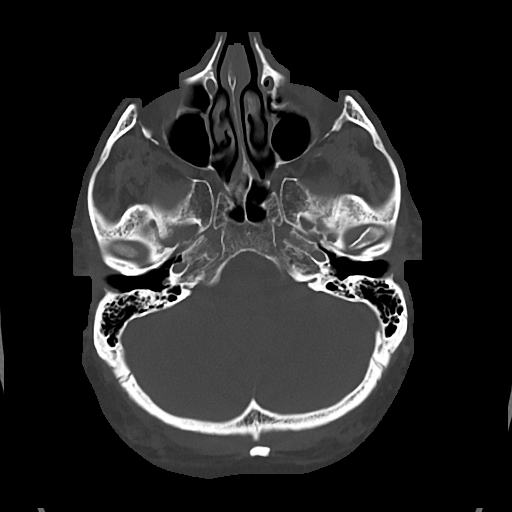
[im 25/83  bone]
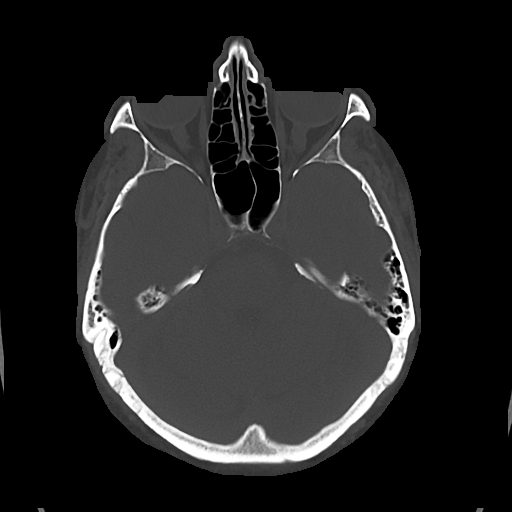

[Series 5: cor soft · coronal · 0.32mm/px · 3 of 69 slices shown]
[im 23/69  brain]
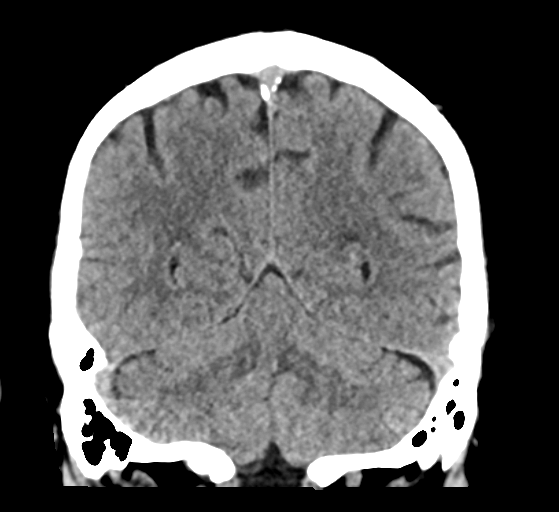
[im 31/69  brain]
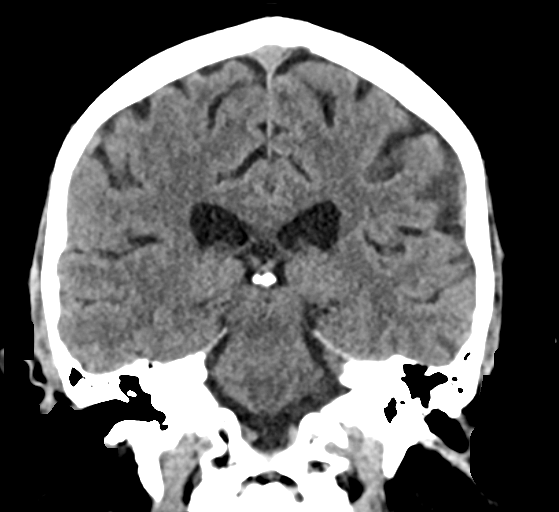
[im 38/69  brain]
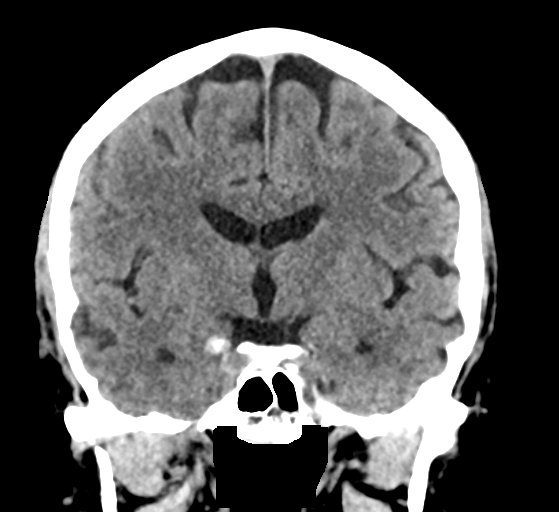

[Series 6: sag soft · sagittal · 0.32mm/px · 3 of 58 slices shown]
[im 20/58  brain]
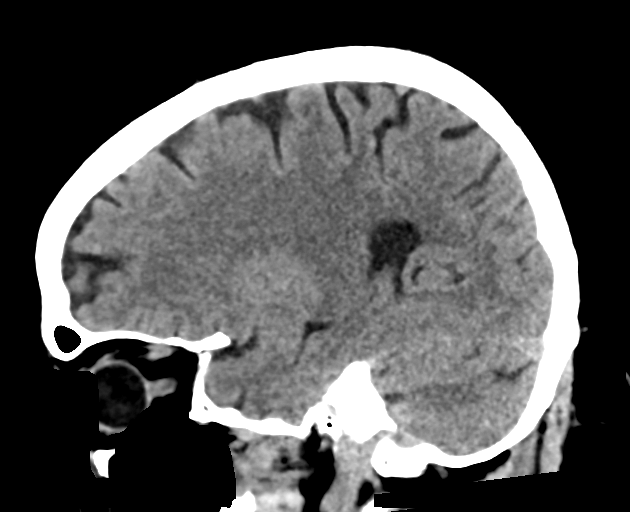
[im 29/58  brain]
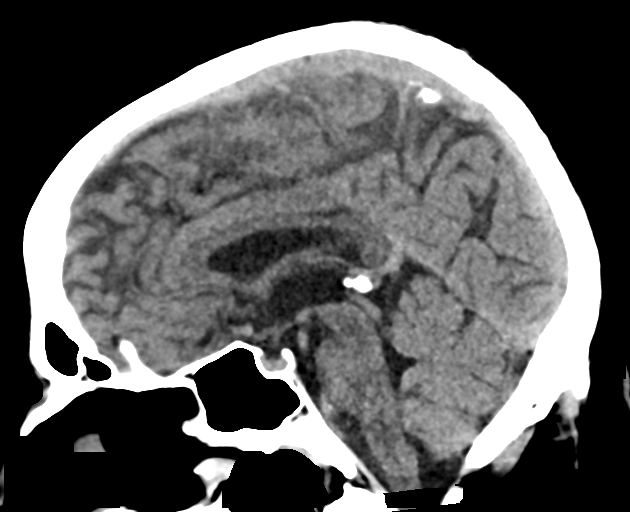
[im 39/58  brain]
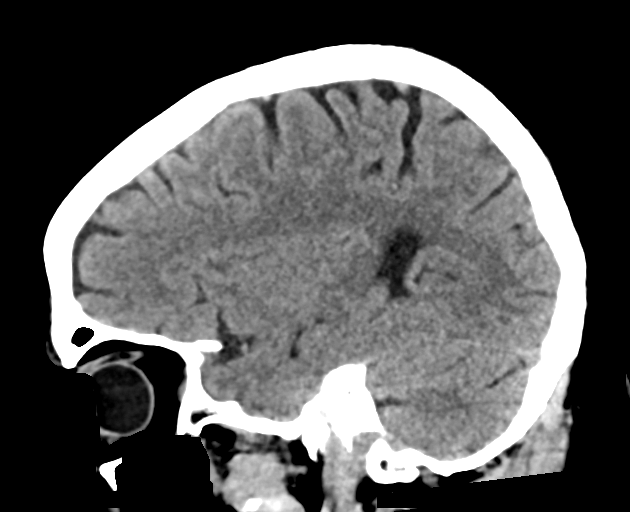

[16 of 47 positions shown; findings below may reference images not displayed]

FINDINGS: Brain: Right temporal lobe partially calcified 13 x 8 mm meningioma
adjacent to the right anterior clinoid process is stable. No
intracranial hemorrhage, large vascular territory infarct,
intra-axial mass nor extra-axial fluid collections. No
hydrocephalus. No effacement of the basal cisterns or fourth
ventricle. Chronic small right cerebellar infarct.

Vascular: Mild atherosclerosis of the carotid siphons. No hyperdense
vessels.

Skull: Normal. Negative for fracture or focal lesion.

Sinuses/Orbits: No acute finding.

Other: None.
IMPRESSION: 1. Chronic stable partially calcified meningioma in the right
temporal lobe fossa. This measures approximately 13 x 8 mm.
2. Tiny chronic infarct of the right cerebellum.
3. No acute intracranial abnormality by CT.

## 2018-05-26 NOTE — Telephone Encounter (Signed)
Bradley Berger from [employer] ph# [phone] re: they are asking for recent chart notes and stating patient is testing 4 x per day-for the last 30 days. Dr. Cordelia Pen signature and date must be on documentation. They also need labs report that states patient was fasting at the time of the labs. Patient was fasting and concurrent Dr.'s signature and date.

## 2018-05-29 NOTE — Telephone Encounter (Signed)
I called patient & he stated that he no longer even used [employer]. He was now using [facility] because he had so much trouble dealing with [employer]. We can disregard any paperwork or future calls from them.

## 2018-05-30 DIAGNOSIS — K740 Hepatic fibrosis: Secondary | ICD-10-CM | POA: Diagnosis not present

## 2018-05-30 DIAGNOSIS — E669 Obesity, unspecified: Secondary | ICD-10-CM | POA: Diagnosis not present

## 2018-05-30 DIAGNOSIS — Z8619 Personal history of other infectious and parasitic diseases: Secondary | ICD-10-CM | POA: Diagnosis not present

## 2018-05-30 DIAGNOSIS — Z7901 Long term (current) use of anticoagulants: Secondary | ICD-10-CM | POA: Diagnosis not present

## 2018-05-30 DIAGNOSIS — G4700 Insomnia, unspecified: Secondary | ICD-10-CM | POA: Diagnosis not present

## 2018-05-30 DIAGNOSIS — E119 Type 2 diabetes mellitus without complications: Secondary | ICD-10-CM | POA: Diagnosis not present

## 2018-05-30 DIAGNOSIS — B182 Chronic viral hepatitis C: Secondary | ICD-10-CM | POA: Diagnosis not present

## 2018-05-30 DIAGNOSIS — K760 Fatty (change of) liver, not elsewhere classified: Secondary | ICD-10-CM | POA: Diagnosis not present

## 2018-05-30 DIAGNOSIS — I10 Essential (primary) hypertension: Secondary | ICD-10-CM | POA: Diagnosis not present

## 2018-05-30 DIAGNOSIS — Z79899 Other long term (current) drug therapy: Secondary | ICD-10-CM | POA: Diagnosis not present

## 2018-05-30 DIAGNOSIS — Z9641 Presence of insulin pump (external) (internal): Secondary | ICD-10-CM | POA: Diagnosis not present

## 2018-05-30 DIAGNOSIS — Z794 Long term (current) use of insulin: Secondary | ICD-10-CM | POA: Diagnosis not present

## 2018-05-30 NOTE — Telephone Encounter (Signed)
I spoke with patient & he is no longer using [employer] at all. He stated that paperwork can be thrown away because he is using [facility]. [facility] already has needed paperwork & there is no issue with them at this time.

## 2018-05-30 NOTE — Telephone Encounter (Signed)
I am happy to come in and sign the fasting word.  Also, we need some documentation as to qid checking

## 2018-06-05 DIAGNOSIS — I10 Essential (primary) hypertension: Secondary | ICD-10-CM | POA: Diagnosis not present

## 2018-06-05 DIAGNOSIS — I872 Venous insufficiency (chronic) (peripheral): Secondary | ICD-10-CM | POA: Diagnosis not present

## 2018-06-05 DIAGNOSIS — Z7901 Long term (current) use of anticoagulants: Secondary | ICD-10-CM | POA: Diagnosis not present

## 2018-06-05 DIAGNOSIS — I2510 Atherosclerotic heart disease of native coronary artery without angina pectoris: Secondary | ICD-10-CM | POA: Diagnosis not present

## 2018-06-05 DIAGNOSIS — I359 Nonrheumatic aortic valve disorder, unspecified: Secondary | ICD-10-CM | POA: Diagnosis not present

## 2018-06-05 DIAGNOSIS — E119 Type 2 diabetes mellitus without complications: Secondary | ICD-10-CM | POA: Diagnosis not present

## 2018-06-21 DIAGNOSIS — D329 Benign neoplasm of meninges, unspecified: Secondary | ICD-10-CM | POA: Diagnosis not present

## 2018-06-21 DIAGNOSIS — R42 Dizziness and giddiness: Secondary | ICD-10-CM | POA: Diagnosis not present

## 2018-06-21 DIAGNOSIS — I10 Essential (primary) hypertension: Secondary | ICD-10-CM | POA: Diagnosis not present

## 2018-06-21 DIAGNOSIS — Z6836 Body mass index (BMI) 36.0-36.9, adult: Secondary | ICD-10-CM | POA: Diagnosis not present

## 2018-06-28 ENCOUNTER — Telehealth: Payer: Self-pay | Admitting: Neurology

## 2018-06-28 NOTE — Telephone Encounter (Signed)
Got a note from Dr Donald Pore office.  No change in meningioma.  Recommended f/u brain MRI in 1 year.

## 2018-07-04 DIAGNOSIS — J189 Pneumonia, unspecified organism: Secondary | ICD-10-CM | POA: Diagnosis not present

## 2018-07-10 DIAGNOSIS — E119 Type 2 diabetes mellitus without complications: Secondary | ICD-10-CM | POA: Diagnosis not present

## 2018-07-10 DIAGNOSIS — Z7901 Long term (current) use of anticoagulants: Secondary | ICD-10-CM | POA: Diagnosis not present

## 2018-07-10 DIAGNOSIS — I872 Venous insufficiency (chronic) (peripheral): Secondary | ICD-10-CM | POA: Diagnosis not present

## 2018-07-10 DIAGNOSIS — I10 Essential (primary) hypertension: Secondary | ICD-10-CM | POA: Diagnosis not present

## 2018-07-10 DIAGNOSIS — I359 Nonrheumatic aortic valve disorder, unspecified: Secondary | ICD-10-CM | POA: Diagnosis not present

## 2018-07-10 DIAGNOSIS — I2510 Atherosclerotic heart disease of native coronary artery without angina pectoris: Secondary | ICD-10-CM | POA: Diagnosis not present

## 2018-07-18 DIAGNOSIS — H43811 Vitreous degeneration, right eye: Secondary | ICD-10-CM | POA: Diagnosis not present

## 2018-07-18 DIAGNOSIS — H1711 Central corneal opacity, right eye: Secondary | ICD-10-CM | POA: Diagnosis not present

## 2018-07-18 DIAGNOSIS — E119 Type 2 diabetes mellitus without complications: Secondary | ICD-10-CM | POA: Diagnosis not present

## 2018-07-18 DIAGNOSIS — H40013 Open angle with borderline findings, low risk, bilateral: Secondary | ICD-10-CM | POA: Diagnosis not present

## 2018-07-18 DIAGNOSIS — G510 Bell's palsy: Secondary | ICD-10-CM | POA: Diagnosis not present

## 2018-07-18 DIAGNOSIS — C719 Malignant neoplasm of brain, unspecified: Secondary | ICD-10-CM | POA: Diagnosis not present

## 2018-07-18 DIAGNOSIS — D4981 Neoplasm of unspecified behavior of retina and choroid: Secondary | ICD-10-CM | POA: Diagnosis not present

## 2018-07-26 DIAGNOSIS — E119 Type 2 diabetes mellitus without complications: Secondary | ICD-10-CM | POA: Diagnosis not present

## 2018-07-26 DIAGNOSIS — I10 Essential (primary) hypertension: Secondary | ICD-10-CM | POA: Diagnosis not present

## 2018-07-26 DIAGNOSIS — I359 Nonrheumatic aortic valve disorder, unspecified: Secondary | ICD-10-CM | POA: Diagnosis not present

## 2018-07-26 DIAGNOSIS — I2510 Atherosclerotic heart disease of native coronary artery without angina pectoris: Secondary | ICD-10-CM | POA: Diagnosis not present

## 2018-07-26 DIAGNOSIS — Z13220 Encounter for screening for lipoid disorders: Secondary | ICD-10-CM | POA: Diagnosis not present

## 2018-07-26 DIAGNOSIS — I872 Venous insufficiency (chronic) (peripheral): Secondary | ICD-10-CM | POA: Diagnosis not present

## 2018-08-01 DIAGNOSIS — J0100 Acute maxillary sinusitis, unspecified: Secondary | ICD-10-CM | POA: Diagnosis not present

## 2018-08-08 DIAGNOSIS — B0239 Other herpes zoster eye disease: Secondary | ICD-10-CM | POA: Diagnosis not present

## 2018-08-09 DIAGNOSIS — E119 Type 2 diabetes mellitus without complications: Secondary | ICD-10-CM | POA: Diagnosis not present

## 2018-08-09 DIAGNOSIS — I359 Nonrheumatic aortic valve disorder, unspecified: Secondary | ICD-10-CM | POA: Diagnosis not present

## 2018-08-09 DIAGNOSIS — Z13220 Encounter for screening for lipoid disorders: Secondary | ICD-10-CM | POA: Diagnosis not present

## 2018-08-09 DIAGNOSIS — I10 Essential (primary) hypertension: Secondary | ICD-10-CM | POA: Diagnosis not present

## 2018-08-09 DIAGNOSIS — H9313 Tinnitus, bilateral: Secondary | ICD-10-CM | POA: Diagnosis not present

## 2018-08-09 DIAGNOSIS — I2510 Atherosclerotic heart disease of native coronary artery without angina pectoris: Secondary | ICD-10-CM | POA: Diagnosis not present

## 2018-08-09 DIAGNOSIS — I872 Venous insufficiency (chronic) (peripheral): Secondary | ICD-10-CM | POA: Diagnosis not present

## 2018-08-09 DIAGNOSIS — H905 Unspecified sensorineural hearing loss: Secondary | ICD-10-CM | POA: Diagnosis not present

## 2018-08-15 ENCOUNTER — Ambulatory Visit (INDEPENDENT_AMBULATORY_CARE_PROVIDER_SITE_OTHER): Payer: Medicare Other | Admitting: Endocrinology

## 2018-08-15 ENCOUNTER — Encounter: Payer: Medicare Other | Attending: Endocrinology | Admitting: Nutrition

## 2018-08-15 ENCOUNTER — Encounter: Payer: Self-pay | Admitting: Endocrinology

## 2018-08-15 VITALS — BP 106/60 | HR 83 | Ht 70.0 in | Wt 244.2 lb

## 2018-08-15 DIAGNOSIS — E1100 Type 2 diabetes mellitus with hyperosmolarity without nonketotic hyperglycemic-hyperosmolar coma (NKHHC): Secondary | ICD-10-CM | POA: Insufficient documentation

## 2018-08-15 DIAGNOSIS — Z794 Long term (current) use of insulin: Secondary | ICD-10-CM

## 2018-08-15 DIAGNOSIS — Z713 Dietary counseling and surveillance: Secondary | ICD-10-CM | POA: Insufficient documentation

## 2018-08-15 DIAGNOSIS — E0800 Diabetes mellitus due to underlying condition with hyperosmolarity without nonketotic hyperglycemic-hyperosmolar coma (NKHHC): Secondary | ICD-10-CM

## 2018-08-15 DIAGNOSIS — E119 Type 2 diabetes mellitus without complications: Secondary | ICD-10-CM

## 2018-08-15 LAB — POCT GLYCOSYLATED HEMOGLOBIN (HGB A1C): Hemoglobin A1C: 7.2 % — AB (ref 4.0–5.6)

## 2018-08-15 NOTE — Progress Notes (Signed)
Subjective:    Patient ID: Bradley Berger, male    DOB: 1951/02/13, 67 y.o.   MRN: [id number]  HPI Pt returns for f/u of diabetes mellitus: DM type: Insulin-requiring type 2 Dx'ed: 4268 Complications: polyneuropathy and CAD Therapy: insulin since 1999 DKA: never Severe hypoglycemia: never Pancreatitis: never Other: he uses a [employer] paradigm pump; we stopped mealtime boluses, as pt was forgetting them.   Interval history:  He takes these pump settings:   basal rate of 2.3 units/HR, 7 AM-10 PM, and 1.2 units/HR 10PM-7AM.   No mealtime bolus.  correction bolus (which some people call "sensitivity," or "insulin sensitivity ratio," or just "isr") of 1 unit for each 25 by which your glucose exceeds 100.  For activity, suspend pump x 1-2 hrs.   no cbg record, but states cbg's are mildly low approx once per week.  This usually happens fasting or in the mid-morning.   Past Medical History:  Diagnosis Date  . Diabetes ([place]) 1997   Last A1C 6.2  . HTN (hypertension)   . Meningioma ([place])   . Parkinson disease ([place])   . S/P aortic valve replacement   . Sudden hearing loss    L ear    Past Surgical History:  Procedure Laterality Date  . BACK SURGERY    . CARDIAC SURGERY    . CATARACT EXTRACTION, BILATERAL    . HEMORROIDECTOMY    . KNEE SURGERY Left     Social History   Socioeconomic History  . Marital status: Married    Spouse name: Not on file  . Number of children: Not on file  . Years of education: Not on file  . Highest education level: Not on file  Occupational History  . Not on file  Social Needs  . Financial resource strain: Not on file  . Food insecurity:    Worry: Not on file    Inability: Not on file  . Transportation needs:    Medical: Not on file    Non-medical: Not on file  Tobacco Use  . Smoking status: Never Smoker  . Smokeless tobacco: Never Used  Substance and Sexual Activity  . Alcohol use: Yes    Alcohol/week: 0.0 standard drinks    Comment:  very rare  . Drug use: No  . Sexual activity: Not on file  Lifestyle  . Physical activity:    Days per week: Not on file    Minutes per session: Not on file  . Stress: Not on file  Relationships  . Social connections:    Talks on phone: Not on file    Gets together: Not on file    Attends religious service: Not on file    Active member of club or organization: Not on file    Attends meetings of clubs or organizations: Not on file    Relationship status: Not on file  . Intimate partner violence:    Fear of current or ex partner: Not on file    Emotionally abused: Not on file    Physically abused: Not on file    Forced sexual activity: Not on file  Other Topics Concern  . Not on file  Social History Narrative  . Not on file    Current Outpatient Medications on File Prior to Visit  Medication Sig Dispense Refill  . aspirin EC 81 MG tablet Take 81 mg by mouth daily.     . cefdinir (OMNICEF) 300 MG capsule     . Cholecalciferol (VITAMIN D3)  5000 units CAPS Take 5,000 Units by mouth every morning.     . famotidine (PEPCID) 10 MG tablet Take 10 mg by mouth every morning.     . gabapentin (NEURONTIN) 600 MG tablet Take 600 mg by mouth daily.     . insulin glulisine (APIDRA) 100 UNIT/ML injection For use in pump, for a total of 60 units/day Dx code E08.00 60 mL 3  . Insulin Infusion Pump Supplies (MINIMED INFUSION SET-MMT 397) MISC 1 Device by Does not apply route every 3 (three) days.    . Insulin Infusion Pump Supplies (MINIMED RESERVOIR 3ML) MISC 1 Device by Does not apply route every 3 (three) days.    Marland Kitchen lisinopril (PRINIVIL,ZESTRIL) 2.5 MG tablet Take by mouth.    . loratadine (CLARITIN) 10 MG tablet Take by mouth.    . metoprolol tartrate (LOPRESSOR) 25 MG tablet Take by mouth 2 (two) times daily.     . Multiple Vitamin (MULTIVITAMIN) tablet Take 1 tablet by mouth daily.    . ranitidine (ZANTAC) 150 MG tablet Take by mouth.    . valACYclovir (VALTREX) 1000 MG tablet Take 1,000  mg by mouth 3 (three) times daily.  0  . warfarin (COUMADIN) 4 MG tablet Take by mouth.    Marland Kitchen artificial tears (LACRILUBE) OINT ophthalmic ointment Place into the right eye every 3 (three) hours as needed for dry eyes. (Patient not taking: Reported on 08/15/2018) 1 g 0   No current facility-administered medications on file prior to visit.     Allergies  Allergen Reactions  . Dilaudid [Hydromorphone Hcl] Other (See Comments)    "flushing"  . Morphine Other (See Comments)    "flushing"    Family History  Problem Relation Age of Onset  . Lung cancer Mother     BP 106/60 (BP Location: Left Arm)   Pulse 83   Ht 5\' 10"  (1.778 m)   Wt 244 lb 3.2 oz (110.8 kg)   SpO2 96%   BMI 35.04 kg/m   Review of Systems Denies LOC    Objective:   Physical Exam VITAL SIGNS:  See vs page GENERAL: no distress Pulses: foot pulses are intact bilaterally.   MSK: no deformity of the feet or ankles.  CV: 1+ bilat edema of the legs, and bilat vv's.  Skin:  no ulcer on the feet or ankles, but there are heavy calluses.  normal color and temp on the feet and ankles.  Neuro: sensation is intact to touch on the feet and ankles.  Ext: There is bilateral onychomycosis of the toenails.     Lab Results  Component Value Date   HGBA1C 7.2 (A) 08/15/2018       Assessment & Plan:  Insulin-requiring type 2 DM, with CAD: well-controlled.  Hypoglycemia: this limits aggressiveness of glycemic control.  Patient Instructions  check your blood sugar 4 times a day: before the 3 meals, and at bedtime.  also check if you have symptoms of your blood sugar being too high or too low.  please keep a record of the readings and bring it to your next appointment here (or you can bring the meter itself).  You can write it on any piece of paper.  please call us sooner if your blood sugar goes below 70, or if you have a lot of readings over 200.  Please continue these pump settings for now:  basal rate of 2.3 units/HR, 7  AM-10 PM, and 1.1 units/HR 10PM-7AM.   No mealtime bolus.  correction bolus (which some people call "sensitivity," or "insulin sensitivity ratio," or just "isr") of 1 unit for each 25 by which your glucose exceeds 100.  For activity, suspend pump x 1-2 hrs. blood tests are requested for you today.  We'll let you know about the results.   Please come back for a follow-up appointment in 4 months.

## 2018-08-15 NOTE — Patient Instructions (Signed)
Call when pump comes in to set up an appointment for training.

## 2018-08-15 NOTE — Progress Notes (Signed)
Patient and his wife were shown the new insulin pumps.  He does not like a touch screen, so he was shown the new [employer] pump.  He filled out paperwork, and it was faxed to [place].  He had no final questions.   We changed his basal rate per Dr. Cordelia Pen orders, and he had no final questions

## 2018-08-15 NOTE — Patient Instructions (Addendum)
check your blood sugar 4 times a day: before the 3 meals, and at bedtime.  also check if you have symptoms of your blood sugar being too high or too low.  please keep a record of the readings and bring it to your next appointment here (or you can bring the meter itself).  You can write it on any piece of paper.  please call us sooner if your blood sugar goes below 70, or if you have a lot of readings over 200.  Please continue these pump settings for now:  basal rate of 2.3 units/HR, 7 AM-10 PM, and 1.1 units/HR 10PM-7AM.   No mealtime bolus.  correction bolus (which some people call "sensitivity," or "insulin sensitivity ratio," or just "isr") of 1 unit for each 25 by which your glucose exceeds 100.  For activity, suspend pump x 1-2 hrs. blood tests are requested for you today.  We'll let you know about the results.   Please come back for a follow-up appointment in 4 months.

## 2018-08-23 DIAGNOSIS — I359 Nonrheumatic aortic valve disorder, unspecified: Secondary | ICD-10-CM | POA: Diagnosis not present

## 2018-08-29 ENCOUNTER — Telehealth: Payer: Self-pay | Admitting: Endocrinology

## 2018-08-29 NOTE — Telephone Encounter (Signed)
please call patient: In order to complete form, I need a record of pt checking cbg qid

## 2018-08-30 NOTE — Telephone Encounter (Signed)
Spoke to pt and he stated that he would fax office requested info but he does not want another pump from [employer] and that it should be coming from [facility].

## 2018-08-30 NOTE — Telephone Encounter (Signed)
Yes, the form is from [facility]

## 2018-09-05 ENCOUNTER — Telehealth: Payer: Self-pay | Admitting: Endocrinology

## 2018-09-20 DIAGNOSIS — Z23 Encounter for immunization: Secondary | ICD-10-CM | POA: Diagnosis not present

## 2018-09-21 DIAGNOSIS — Z7901 Long term (current) use of anticoagulants: Secondary | ICD-10-CM | POA: Diagnosis not present

## 2018-09-21 DIAGNOSIS — I359 Nonrheumatic aortic valve disorder, unspecified: Secondary | ICD-10-CM | POA: Diagnosis not present

## 2018-09-29 ENCOUNTER — Telehealth: Payer: Self-pay | Admitting: Endocrinology

## 2018-09-29 NOTE — Telephone Encounter (Signed)
please call patient: I received cbg records--thanks.  Looks pretty good.  Please continue the same pump settings.  I'll see you next time.

## 2018-09-29 NOTE — Telephone Encounter (Signed)
Records were sent per request from [facility] see previous phone message

## 2018-10-03 ENCOUNTER — Telehealth: Payer: Self-pay | Admitting: Endocrinology

## 2018-10-03 NOTE — Telephone Encounter (Signed)
Called pt to determine if he had been fasting for the Peptide. Pt is unable to recall if he had fasted. Further added that he believes he only can have this blood test done ONCE per lifetime. Asked if we could check with his insurance. Discussed pt request with Adrienne. Per Vincente Liberty, we cannot check with pt insurance. However, we will doctor bill test. Once pt receives bill, pt will need to bring the bill in to our office. Called pt to make him aware. States he will need to discuss with his wife before scheduling an appt with lab. Reminded pt that he WILL need to FAST for this test. Verbalized acceptance and understanding.

## 2018-10-03 NOTE — Telephone Encounter (Signed)
Letter of medical necessity was sent to [facility] so  question #4 was marked no.But if applies to pt is need to be marked yes.   Was pt fasting for Peppide test if so it need to be noted.  If pt has been test blood gluclose 4 times per day, that need to be stated also.  Please fax to [phone].

## 2018-10-22 ENCOUNTER — Other Ambulatory Visit: Payer: Self-pay | Admitting: Endocrinology

## 2018-10-22 DIAGNOSIS — Z794 Long term (current) use of insulin: Principal | ICD-10-CM

## 2018-10-22 DIAGNOSIS — E0800 Diabetes mellitus due to underlying condition with hyperosmolarity without nonketotic hyperglycemic-hyperosmolar coma (NKHHC): Secondary | ICD-10-CM

## 2018-10-23 DIAGNOSIS — I359 Nonrheumatic aortic valve disorder, unspecified: Secondary | ICD-10-CM | POA: Diagnosis not present

## 2018-10-23 DIAGNOSIS — Z7901 Long term (current) use of anticoagulants: Secondary | ICD-10-CM | POA: Diagnosis not present

## 2018-10-25 ENCOUNTER — Other Ambulatory Visit (INDEPENDENT_AMBULATORY_CARE_PROVIDER_SITE_OTHER): Payer: Medicare Other

## 2018-10-25 DIAGNOSIS — E0800 Diabetes mellitus due to underlying condition with hyperosmolarity without nonketotic hyperglycemic-hyperosmolar coma (NKHHC): Secondary | ICD-10-CM | POA: Diagnosis not present

## 2018-10-25 DIAGNOSIS — Z794 Long term (current) use of insulin: Secondary | ICD-10-CM | POA: Diagnosis not present

## 2018-10-25 LAB — BASIC METABOLIC PANEL
BUN: 18 mg/dL (ref 6–23)
CO2: 30 mEq/L (ref 19–32)
Calcium: 9.3 mg/dL (ref 8.4–10.5)
Chloride: 105 mEq/L (ref 96–112)
Creatinine, Ser: 1.02 mg/dL (ref 0.40–1.50)
GFR: 76.96 mL/min (ref >60.00)
GLUCOSE: 83 mg/dL (ref 70–99)
Potassium: 4.5 mEq/L (ref 3.5–5.1)
SODIUM: 140 meq/L (ref 135–145)

## 2018-10-27 LAB — C-PEPTIDE: C PEPTIDE: 0.47 ng/mL — AB (ref 0.80–3.85)

## 2018-11-01 NOTE — Progress Notes (Signed)
Bradley Berger was notified that he will qualify for the 670G pump.  He does not want the sensor.  Information filled out and set to [employer].

## 2018-11-20 DIAGNOSIS — Z7901 Long term (current) use of anticoagulants: Secondary | ICD-10-CM | POA: Diagnosis not present

## 2018-11-29 ENCOUNTER — Telehealth: Payer: Self-pay | Admitting: Endocrinology

## 2018-11-29 NOTE — Telephone Encounter (Signed)
Once received, paperwork will be reviewed to determine if any signatures are required from Dr. Loanne Drilling. We will fax once compelted

## 2018-11-29 NOTE — Telephone Encounter (Signed)
[facility] care called stating they are requesting labs that show he is fasting and also medical necessities. Stated also he needs to be testing at least 4 times a day due to medicare.This will be coming on the fax today. Please Advise, thanks

## 2018-11-30 ENCOUNTER — Telehealth: Payer: Self-pay | Admitting: Endocrinology

## 2018-11-30 NOTE — Telephone Encounter (Signed)
please call patient: I order to complete the form, I need evidence pt is testing cbg 4/day

## 2018-11-30 NOTE — Telephone Encounter (Signed)
Called pt as requested. If monitoring CBG's 4 or more times per day, would like pt to bring in monitor for downloading. Evidence of this will be sent along with forms for [facility]. LVM requesting returned call.

## 2018-12-04 ENCOUNTER — Telehealth: Payer: Self-pay | Admitting: Endocrinology

## 2018-12-04 NOTE — Telephone Encounter (Signed)
Patient called back returning Ammies call to him..patient wanted to know what she left voicemail about. I advised him that she was calling regarding blood sugar monitoring he states he has provided his blood sugar logs to our office and to [employer] on several occasions.    Patient would like a call back regarding what he needs to do to facilitate the paperwork for his new pump from [place].  Patient states that his has been in the works for quite some time.

## 2018-12-05 NOTE — Telephone Encounter (Signed)
Message left on his machine that I have talked with Amy from his insurance company, and will work on getting several things together on what he needs to get his insulin pump.

## 2018-12-05 NOTE — Telephone Encounter (Signed)
Another message left on machine that I need a sheet of paper with blood sugar readings from the last 3 weeks showing that he is testing 4X/day.

## 2018-12-11 NOTE — Telephone Encounter (Signed)
Leonia Reader, RN, has also made numerous attempts to reach pt to attain this information but has been unsuccessful in her attempts. Letter has been mailed.

## 2018-12-12 ENCOUNTER — Other Ambulatory Visit: Payer: Self-pay | Admitting: Endocrinology

## 2018-12-14 ENCOUNTER — Other Ambulatory Visit: Payer: Self-pay

## 2018-12-14 DIAGNOSIS — Z794 Long term (current) use of insulin: Principal | ICD-10-CM

## 2018-12-14 DIAGNOSIS — E0800 Diabetes mellitus due to underlying condition with hyperosmolarity without nonketotic hyperglycemic-hyperosmolar coma (NKHHC): Secondary | ICD-10-CM

## 2018-12-14 MED ORDER — INSULIN GLULISINE 100 UNIT/ML IJ SOLN: INTRAMUSCULAR | 0 refills | Status: DC

## 2018-12-18 ENCOUNTER — Telehealth: Payer: Self-pay

## 2018-12-18 ENCOUNTER — Encounter: Payer: Self-pay | Admitting: Endocrinology

## 2018-12-18 ENCOUNTER — Ambulatory Visit (INDEPENDENT_AMBULATORY_CARE_PROVIDER_SITE_OTHER): Payer: Medicare Other | Admitting: Endocrinology

## 2018-12-18 VITALS — BP 144/80 | HR 62 | Ht 70.0 in | Wt 244.2 lb

## 2018-12-18 DIAGNOSIS — E0800 Diabetes mellitus due to underlying condition with hyperosmolarity without nonketotic hyperglycemic-hyperosmolar coma (NKHHC): Secondary | ICD-10-CM

## 2018-12-18 DIAGNOSIS — Z794 Long term (current) use of insulin: Secondary | ICD-10-CM

## 2018-12-18 LAB — POCT GLYCOSYLATED HEMOGLOBIN (HGB A1C): Hemoglobin A1C: 6.7 % — AB (ref 4.0–5.6)

## 2018-12-18 MED ORDER — INSULIN GLULISINE 100 UNIT/ML IJ SOLN: INTRAMUSCULAR | 3 refills | Status: DC

## 2018-12-18 NOTE — Telephone Encounter (Signed)
Per Dr. Loanne Drilling request, called [facility] to request a new LMN form for insulin pump and insulin pump supplies. Last completed 09/05/18. Rather than update info, initial and date, Dr. Loanne Drilling would prefer to complete a new form. LVM requesting form be re-faxed OR to call our office re: this request.

## 2018-12-18 NOTE — Progress Notes (Signed)
Subjective:    Patient ID: Bradley Berger, male    DOB: 03/21/1951, 68 y.o.   MRN: [id number]  HPI Pt returns for f/u of diabetes mellitus: DM type: Insulin-requiring type 2.  Dx'ed: 2706 Complications: polyneuropathy and CAD.   Therapy: insulin since 1999 DKA: never Severe hypoglycemia: never Pancreatitis: never Other: he uses a [employer] paradigm pump; we stopped mealtime boluses, as pt was forgetting them.    Interval history:  He takes these pump settings:   basal rate of 2.3 units/HR, 7 AM-10 PM, and 1.1 units/HR 10PM-7AM.   No mealtime bolus.  correction bolus (which some people call "sensitivity," or "insulin sensitivity ratio," or just "isr") of 1 unit for each 25 by which your glucose exceeds 100.  For activity, he suspends pump x 1-2 hrs. He brings a record of his cbg's which I have reviewed today.  It varies from 59-244.  It is in general lowest in the middle of the day.  He says this happens when a meal is missed or delayed.   Past Medical History:  Diagnosis Date  . Diabetes ([place]) 1997   Last A1C 6.2  . HTN (hypertension)   . Meningioma ([place])   . Parkinson disease ([place])   . S/P aortic valve replacement   . Sudden hearing loss    L ear    Past Surgical History:  Procedure Laterality Date  . BACK SURGERY    . CARDIAC SURGERY    . CATARACT EXTRACTION, BILATERAL    . HEMORROIDECTOMY    . KNEE SURGERY Left     Social History   Socioeconomic History  . Marital status: Married    Spouse name: Not on file  . Number of children: Not on file  . Years of education: Not on file  . Highest education level: Not on file  Occupational History  . Not on file  Social Needs  . Financial resource strain: Not on file  . Food insecurity:    Worry: Not on file    Inability: Not on file  . Transportation needs:    Medical: Not on file    Non-medical: Not on file  Tobacco Use  . Smoking status: Never Smoker  . Smokeless tobacco: Never Used  Substance and Sexual  Activity  . Alcohol use: Yes    Alcohol/week: 0.0 standard drinks    Comment: very rare  . Drug use: No  . Sexual activity: Not on file  Lifestyle  . Physical activity:    Days per week: Not on file    Minutes per session: Not on file  . Stress: Not on file  Relationships  . Social connections:    Talks on phone: Not on file    Gets together: Not on file    Attends religious service: Not on file    Active member of club or organization: Not on file    Attends meetings of clubs or organizations: Not on file    Relationship status: Not on file  . Intimate partner violence:    Fear of current or ex partner: Not on file    Emotionally abused: Not on file    Physically abused: Not on file    Forced sexual activity: Not on file  Other Topics Concern  . Not on file  Social History Narrative  . Not on file    Current Outpatient Medications on File Prior to Visit  Medication Sig Dispense Refill  . aspirin EC 81 MG tablet Take 81  mg by mouth daily.     . cefdinir (OMNICEF) 300 MG capsule     . Cholecalciferol (VITAMIN D3) 5000 units CAPS Take 5,000 Units by mouth every morning.     . famotidine (PEPCID) 10 MG tablet Take 10 mg by mouth every morning.     . gabapentin (NEURONTIN) 600 MG tablet Take 600 mg by mouth daily.     . Insulin Infusion Pump Supplies (MINIMED INFUSION SET-MMT 397) MISC 1 Device by Does not apply route every 3 (three) days.    . Insulin Infusion Pump Supplies (MINIMED RESERVOIR 3ML) MISC 1 Device by Does not apply route every 3 (three) days.    Marland Kitchen lisinopril (PRINIVIL,ZESTRIL) 2.5 MG tablet Take by mouth.    . loratadine (CLARITIN) 10 MG tablet Take by mouth.    . metoprolol tartrate (LOPRESSOR) 25 MG tablet Take by mouth 2 (two) times daily.     . Multiple Vitamin (MULTIVITAMIN) tablet Take 1 tablet by mouth daily.    . ranitidine (ZANTAC) 150 MG tablet Take by mouth.    . valACYclovir (VALTREX) 1000 MG tablet Take 1,000 mg by mouth 3 (three) times daily.  0    . warfarin (COUMADIN) 4 MG tablet Take by mouth.     No current facility-administered medications on file prior to visit.     Allergies  Allergen Reactions  . Dilaudid [Hydromorphone Hcl] Other (See Comments)    "flushing"  . Morphine Other (See Comments)    "flushing"    Family History  Problem Relation Age of Onset  . Lung cancer Mother     BP (!) 144/80 (BP Location: Left Arm, Patient Position: Sitting, Cuff Size: Normal)   Pulse 62   Ht 5\' 10"  (1.778 m)   Wt 244 lb 3.2 oz (110.8 kg)   SpO2 95%   BMI 35.04 kg/m    Review of Systems He denies LOC    Objective:   Physical Exam VITAL SIGNS:  See vs page GENERAL: no distress Pulses: foot pulses are intact bilaterally.   MSK: no deformity of the feet or ankles.  CV: 1+ bilat edema of the legs, and bilat vv's.  Skin:  no ulcer on the feet or ankles, but there are heavy calluses.  normal color and temp on the feet and ankles.  Neuro: sensation is intact to touch on the feet and ankles.  Ext: There is bilateral onychomycosis of the toenails.  Lab Results  Component Value Date   HGBA1C 6.7 (A) 12/18/2018       Assessment & Plan:  HTN: is noted today.   Insulin-requiring type 2 DM, with PN: overcontrolled.   Hypoglycemia: in view of CAD, he should avoid this.    Patient Instructions  Your blood pressure is high today.  Please see your primary care provider soon, to have it rechecked check your blood sugar 4 times a day: before the 3 meals, and at bedtime.  also check if you have symptoms of your blood sugar being too high or too low.  please keep a record of the readings and bring it to your next appointment here (or you can bring the meter itself).  You can write it on any piece of paper.  please call us sooner if your blood sugar goes below 70, or if you have a lot of readings over 200.  Please take these pump settings:  basal rate of 2.2 units/HR, 7 AM-10 PM, and 1.1 units/HR 10PM-7AM.   No mealtime bolus.  correction bolus (which some people call "sensitivity," or "insulin sensitivity ratio," or just "isr") of 1 unit for each 25 by which your glucose exceeds 100.  For activity, suspend pump x 1-2 hrs. On this type of insulin pump schedule, you should eat meals on a regular schedule.  If a meal is missed or significantly delayed, your blood sugar could go low.  Please come back for a follow-up appointment in 4 months.

## 2018-12-18 NOTE — Patient Instructions (Addendum)
Your blood pressure is high today.  Please see your primary care provider soon, to have it rechecked check your blood sugar 4 times a day: before the 3 meals, and at bedtime.  also check if you have symptoms of your blood sugar being too high or too low.  please keep a record of the readings and bring it to your next appointment here (or you can bring the meter itself).  You can write it on any piece of paper.  please call us sooner if your blood sugar goes below 70, or if you have a lot of readings over 200.  Please take these pump settings:  basal rate of 2.2 units/HR, 7 AM-10 PM, and 1.1 units/HR 10PM-7AM.   No mealtime bolus.  correction bolus (which some people call "sensitivity," or "insulin sensitivity ratio," or just "isr") of 1 unit for each 25 by which your glucose exceeds 100.  For activity, suspend pump x 1-2 hrs. On this type of insulin pump schedule, you should eat meals on a regular schedule.  If a meal is missed or significantly delayed, your blood sugar could go low.  Please come back for a follow-up appointment in 4 months.

## 2018-12-25 ENCOUNTER — Telehealth: Payer: Self-pay | Admitting: Endocrinology

## 2018-12-25 NOTE — Telephone Encounter (Signed)
Amy from [facility] ph# [phone] requests to be called re: they faxed a form that information needs to be corrected and sent back so that patient can get pump. Please call  Amy at the above phone# to discuss/clarify. Amy has called and faxed since September 2019.

## 2018-12-25 NOTE — Telephone Encounter (Signed)
Returned Bradley Berger's call. LVM informing her of the VM left on 12/18/18 @ 4pm requesting new LMN for insulin pump and insulin pump supplies form. LVM advising she call me back to inform me about the status of this form.

## 2018-12-26 DIAGNOSIS — Z7901 Long term (current) use of anticoagulants: Secondary | ICD-10-CM | POA: Diagnosis not present

## 2018-12-28 DIAGNOSIS — M542 Cervicalgia: Secondary | ICD-10-CM | POA: Diagnosis not present

## 2018-12-28 DIAGNOSIS — R5383 Other fatigue: Secondary | ICD-10-CM | POA: Diagnosis not present

## 2018-12-29 ENCOUNTER — Telehealth: Payer: Self-pay | Admitting: Neurology

## 2018-12-29 NOTE — Telephone Encounter (Signed)
Spoke with patient's wife.  She states patient has had some viral symptoms over the last few days, but no fevers. He has had erratic blood sugars. No cough. He started to develop pain in his neck on both sides (like someone is pinching the sides of his neck). He can hardly turn it from side to side or look up and down.  They did see PCP who evaluated patient and did blood work, which she states was okay.  I advised that we would usually give neck pain time to r/o muscle issue, etc. But she wanted to call to make sure it wouldn't be something like meningitis, etc. I advised if problems escalate (trouble swallowing, sudden weakness) to be seen in ER over the weekend, but otherwise I would make Dr. Carles Collet aware of symptoms and let her know if she is concerns from Neurological standpoint.  Dr. Carles Collet Juluis Rainier.

## 2018-12-29 NOTE — Telephone Encounter (Signed)
Left VM that he was sick and had a severely stiff neck. She had some questions for you. Please call her back at [phone]. Thanks!

## 2019-01-02 NOTE — Telephone Encounter (Signed)
Make sure no f/c.  No headache.  No focal/lateralizing neuro s/s.  If all of these are negative, then can closely watch at home.  If not, low threshold in this age group to go to ER

## 2019-01-02 NOTE — Telephone Encounter (Signed)
Left message on machine for patient's wife to call back.   

## 2019-01-02 NOTE — Telephone Encounter (Signed)
Spoke with patient's wife.  She states they did see primary care who "ruled out" meningitis per what they told her. She states he did see a massage therapist over the weekend and now he can turn his head. Still sore but better. Please advise if you still recommend a trip to the ER now. t

## 2019-01-02 NOTE — Telephone Encounter (Signed)
Needs to go to ER to r/o meningitis.  I've been out of the office so sorry took so long to respond (unsure why it wasn't responded to by others)

## 2019-01-03 ENCOUNTER — Telehealth: Payer: Self-pay | Admitting: Endocrinology

## 2019-01-03 NOTE — Telephone Encounter (Signed)
Returned call. States records have been reviewed. Pt is checking CBG's 3.8 times per day. Requesting CBG logs from 12/18/18 through today.  Called pt and informed of [facility] review. Advised to provide logs as Oletta Lamas has requested. Provided with fax #. States he will fax. Will await logs and send once received.

## 2019-01-03 NOTE — Telephone Encounter (Signed)
Amy Edison Pace from [employer] is calling in regards to speaking with Ammie about ordering a pump for this patient.  Ph # [phone]

## 2019-01-03 NOTE — Telephone Encounter (Signed)
Spoke with patient's wife. She states no f/c. No neuro symptoms other than slight headache at top of head. He is sleeping a lot (normally not a good sleeper). She will take him to ER at any sign of changes or decline.

## 2019-01-04 NOTE — Telephone Encounter (Signed)
Additional logs have been faxed. Confirmation received

## 2019-01-24 DIAGNOSIS — I2510 Atherosclerotic heart disease of native coronary artery without angina pectoris: Secondary | ICD-10-CM | POA: Diagnosis not present

## 2019-01-24 DIAGNOSIS — I10 Essential (primary) hypertension: Secondary | ICD-10-CM | POA: Diagnosis not present

## 2019-01-24 DIAGNOSIS — Z7901 Long term (current) use of anticoagulants: Secondary | ICD-10-CM | POA: Diagnosis not present

## 2019-01-24 DIAGNOSIS — I872 Venous insufficiency (chronic) (peripheral): Secondary | ICD-10-CM | POA: Diagnosis not present

## 2019-01-24 DIAGNOSIS — I359 Nonrheumatic aortic valve disorder, unspecified: Secondary | ICD-10-CM | POA: Diagnosis not present

## 2019-01-26 ENCOUNTER — Telehealth: Payer: Self-pay | Admitting: Endocrinology

## 2019-01-26 NOTE — Telephone Encounter (Signed)
[facility] is calling in regards to the a pump order that they have been working with [place] on.   they are needing proof that he is testing 4 times a day.  Logs or information stating that he is testing this many times.    Amy.  Ph- [phone]

## 2019-01-26 NOTE — Telephone Encounter (Signed)
Called pt and requested he once again supply CBG readings AFTER 01/03/19. All CBG readings have been supplied as requested but pt averages 3.8 to 3.9 per day. Failure to provide proof of 4 CBG readings per day will result in denial. Pt is aware. Continues to state he does miss a CBG from time to time. Again emphasized, if he is not consistent, he will not be approved. Again requested reading after 01/03/19. States he would prefer to talk to Amy himself or her boss. At this time, Amy has all documentation we have received up to 01/03/19. No further documentation to provide.

## 2019-01-30 ENCOUNTER — Telehealth: Payer: Self-pay | Admitting: Endocrinology

## 2019-01-30 NOTE — Telephone Encounter (Signed)
I received and signer cbg record, checked 4/day

## 2019-02-12 DIAGNOSIS — Z23 Encounter for immunization: Secondary | ICD-10-CM | POA: Diagnosis not present

## 2019-02-18 NOTE — Progress Notes (Signed)
Subjective:   Bradley Berger was seen in consultation in the movement disorder clinic at the request of [name], [name], MD.  The evaluation is for tremor.  The patient has previously seen a neurologist but I don't have any of those records.  I did review records from his PCP.    This patient is accompanied in the office by his spouse who supplements the history.   The patient is a 68 y.o. right handed male with a history of tremor.  Tremor has been present for 1-1.5 years and is located in both hands, but the right seems worse.  It is intermittent.  It is worse when BS is low.  He saw the neurologist in [place] neurologist about a year ago and was started on primidone and he took that for about 3 months.  He was told he needed to d/c that when he started the [facility] and he is now disease free.  He is on gabapentin 600 mg but that is for neuropathy and takes that q hs.  There is no family hx of tremor.  Pt admits that he had a friend who came here who was previously dx with "tremor" and ultimately had PD.    Affected by caffeine:  No. (doesn't drink coffee) Affected by alcohol:  unknown (only drinks 12 pack beer per year) Affected by stress:  Yes.   Affected by fatigue:  Yes.   Spills soup if on spoon:  no Spills glass of liquid if full:  No. Affects ADL's (tying shoes, brushing teeth, etc):  No.  Current/Previously tried tremor medications: primidone (taken off due to starting harvoni for hep c)  Current medications that may exacerbate tremor:  n/a  Outside reports reviewed: historical medical records and office notes.  02/07/17 update:  Patient follows up today, accompanied by his wife who supplements the history.  Patient has a history of mild resting tremor and I last saw him in July, 2017.  The patient states that it has stayed the same but wife states that it has gotten a little worse.  Most noticeable when holding a book or if tired.  He is R hand dominant and R hand may  shake more than the L.  No falls.  Not exercising to any significant degree.  Takes 30 min nap in afternoon.  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  No records are available.  With one of the vertigo epsiodes, he had a syncopal episode.   He went to the hospital and was dx with "a small brain tumor" per wife.  I was able to obtain this per wife and he was dx with a paraclinoid meningioma.  Hearing loss started to return and was uncomfortable as he heard loud "static" in the ear.  That is better but hearing regressed again.  08/18/17 update:  Patient seen today in follow-up for his tremor.  He is accompanied by his wife who supplements the history.  His tremor has been stable per patient but wife states its a little worse.  Pt will notice it when fatigued.  No falls.  He is doing water aerobics for exercise.  MRI was done in March, 2018 to evaluate his known meningioma.  It is in close proximity to the right ICA terminus.  Because of this,  he was sent for an evaluation with Dr. Vertell Limber.  I received a note from him dated 03/28/2017.  They're just going to follow this and repeat the scan in one year per his notes.  The patient states, however, that he has an appointment with him in October and thinks he has a repeat scan before that.  02/16/18 update: Patient is following up today for his tremor.  He is accompanied by his daughter who supplements the history.  He has been exercising "when I can."  He has exercised less over the last month (was 2-3 days per week).    He has had no falls.  No lightheadedness or near syncope.  Tremor, at times, is worse and sometimes it is better.  Daughter notes its worse with fatigue.  Records have been reviewed since last visit.  He has seen Dr. Loanne Drilling for his diabetes.  He was in the emergency room in September for Bell's palsy.  It lasted  about 2 weeks.  02/19/19 update:  Pt seen in f/u for tremor.  This patient is accompanied in the office by his wife who supplements the history.  He has better and worse times with tremor.  Wife notes some head tremor.  Some tremor with writing.  He is more concerned that he has PD.  He is dreaming more at night.  One night wife woke up and his hand was right over her face.  Pt denies falls.  Pt denies lightheadedness, near syncope.  No hallucinations.  Mood has been good.  Not exercising except for walking the dog three times per day.  Quit going to [employer].  They ask about going to [facility].  Having more trouble getting up and down from the chair.  The records that were made available to me were reviewed.  Family called on 12/29/18 with "sick and a severely stiff neck." they apparently saw PCP before calling our office.  Once I got the message, I advised that needed to go to ER to r/o meningitis.  States today that pt just got better with time and with massage therapy.  He still has some limited mobility but much better than it was.  The records that were made available to me were reviewed, including endocrinology records.  Allergies  Allergen Reactions  . Dilaudid [Hydromorphone Hcl] Other (See Comments)    "flushing"  . Morphine Other (See Comments)    "flushing"    Outpatient Encounter Medications as of 02/19/2019  Medication Sig  . aspirin EC 81 MG tablet Take 81 mg by mouth daily.   . Cholecalciferol (VITAMIN D3) 5000 units CAPS Take 5,000 Units by mouth every morning.   . famotidine (PEPCID) 10 MG tablet Take 10 mg by mouth every morning.   . gabapentin (NEURONTIN) 600 MG tablet Take 600 mg by mouth daily.   . insulin glulisine (APIDRA) 100 UNIT/ML injection FOR USE IN PUMP FOR A TOTAL OF 60 UNITS A DAY; E11.9  . Insulin Infusion Pump Supplies (MINIMED INFUSION SET-MMT 397) MISC 1 Device by Does not apply route every 3 (three) days.  . Insulin Infusion Pump Supplies (MINIMED RESERVOIR 3ML) MISC 1  Device by Does not apply route every 3 (three) days.  Marland Kitchen lisinopril (PRINIVIL,ZESTRIL) 2.5 MG tablet Take by mouth.  . loratadine (CLARITIN) 10 MG tablet Take by mouth.  . metoprolol tartrate (LOPRESSOR) 25 MG tablet Take by mouth 2 (two) times daily.   . Multiple Vitamin (MULTIVITAMIN) tablet Take 1 tablet by mouth daily.  Marland Kitchen  valACYclovir (VALTREX) 1000 MG tablet Take 1,000 mg by mouth 3 (three) times daily.  Marland Kitchen warfarin (COUMADIN) 4 MG tablet Take by mouth.  . [DISCONTINUED] cefdinir (OMNICEF) 300 MG capsule   . [DISCONTINUED] ranitidine (ZANTAC) 150 MG tablet Take by mouth.   No facility-administered encounter medications on file as of 02/19/2019.     Past Medical History:  Diagnosis Date  . Diabetes ([place]) 1997   Last A1C 6.2  . HTN (hypertension)   . Meningioma ([place])   . Parkinson disease ([place])   . S/P aortic valve replacement   . Sudden hearing loss    L ear    Past Surgical History:  Procedure Laterality Date  . BACK SURGERY    . CARDIAC SURGERY    . CATARACT EXTRACTION, BILATERAL    . HEMORROIDECTOMY    . KNEE SURGERY Left     Social History   Socioeconomic History  . Marital status: Married    Spouse name: Not on file  . Number of children: Not on file  . Years of education: Not on file  . Highest education level: Not on file  Occupational History  . Not on file  Social Needs  . Financial resource strain: Not on file  . Food insecurity:    Worry: Not on file    Inability: Not on file  . Transportation needs:    Medical: Not on file    Non-medical: Not on file  Tobacco Use  . Smoking status: Never Smoker  . Smokeless tobacco: Never Used  Substance and Sexual Activity  . Alcohol use: Yes    Alcohol/week: 0.0 standard drinks    Comment: very rare  . Drug use: No  . Sexual activity: Not on file  Lifestyle  . Physical activity:    Days per week: Not on file    Minutes per session: Not on file  . Stress: Not on file  Relationships  . Social connections:     Talks on phone: Not on file    Gets together: Not on file    Attends religious service: Not on file    Active member of club or organization: Not on file    Attends meetings of clubs or organizations: Not on file    Relationship status: Not on file  . Intimate partner violence:    Fear of current or ex partner: Not on file    Emotionally abused: Not on file    Physically abused: Not on file    Forced sexual activity: Not on file  Other Topics Concern  . Not on file  Social History Narrative  . Not on file    Family Status  Relation Name Status  . Mother  Deceased  . Father  Deceased       accident  . Sister  Alive  . Sister  Alive    Review of Systems Review of Systems  Constitutional: Negative.   HENT: Negative.   Eyes: Negative.   Respiratory: Negative.   Cardiovascular: Negative.   Gastrointestinal: Negative.   Skin: Negative.   Psychiatric/Behavioral: The patient is nervous/anxious.       Objective:   VITALS:   Vitals:   02/19/19 1059  BP: 108/68  Pulse: 60  SpO2: 96%  Weight: 247 lb (112 kg)  Height: 5\' 10"  (1.778 m)    Gen:  Appears stated age and in NAD. HEENT:  Normocephalic, atraumatic. The mucous membranes are moist. The superficial temporal arteries are without ropiness or tenderness.  Cardiovascular: Regular rate and rhythm. Lungs: Clear to auscultation bilaterally. Neck: There are no carotid bruits noted bilaterally.  NEUROLOGICAL:  Orientation:  The patient is alert and oriented x 3.   Cranial nerves: There is good facial symmetry.  Extraocular muscles are intact and visual fields are full to confrontational testing. Speech is fluent and clear. Soft palate rises symmetrically and there is no tongue deviation. Hearing is intact to conversational tone. Sensation: Sensation is intact to light touch throughout Motor: Strength is 5/5 in the bilateral upper and lower extremities.  Shoulder shrug is equal bilaterally.  There is no pronator  drift.  There are no fasciculations noted.  Movement examination: Tone: There is normal tone in the RUE.  There is normal tone in the left upper extremity today.  There is mild increase in tone in the left lower extremity.  Tone is normal in the right lower extremity. Abnormal movements: There is a very rare rest tremor in the bilateral UE only with distraction procedures (no rest tremor otherwise) Coordination:  There is  decremation with RAM's, with finger taps on the L and alternation of supination/pronation of the forearm on the L (same as previous).  All other RAMs are good.   Gait and Station:   The patient arises easily out of the chair without the use of his hands.  He has foot drop on the L.  He walks well on the hall with good swing.  He has a neg pull test.   LABS  TSH done 03/24/16 2.83  Lab Results  Component Value Date   HGBA1C 6.7 (A) 12/18/2018        Assessment/Plan:   1.  Probable early mild Parkinsons disease  -Discussed diagnosis in detail.  Discussed pathophysiology.  Discussed potential prognosis.  Discussed medications.  Opted to hold on that for now.  -RX for LSVT PT  -needs to get back to dedicated exercise  2.  L foot drop  -probably due to L5-S1 radiculopathy and he is s/p surgical intervention and no longer feels pain.    3.  Diabetic peripheral neuropathy  -talked about safety.  Is the likely etiology for balance issues.    4.  Paracliniod meningioma  -He is now following with Dr. Vertell Limber.  He is following with Dr. Vertell Limber on a yearly basis.  5.   REM behavior disorder  -This is commonly associated with PD and the patient is experiencing this.  We discussed that this can be very serious and even harmful.  We talked about medications as well as physical barriers to put in the bed (particularly soft bed rails, pillow barriers).  We talked about moving the night stand so that it is not so close to the side of the bed.    We decided to try klonopin, 0.5 mg,  1/2 tablet at night.  Risks, benefits, side effects and alternative therapies were discussed.  The opportunity to ask questions was given and they were answered to the best of my ability.  The patient expressed understanding and willingness to follow the outlined treatment protocols.  -d/c tylenol PM  6.  Follow-up in 5 months, sooner should new neurologic issues arise.  Much greater than 50% of this visit was spent in counseling and coordinating care.  Total face to face time:  30 min

## 2019-02-19 ENCOUNTER — Encounter: Payer: Self-pay | Admitting: Neurology

## 2019-02-19 ENCOUNTER — Ambulatory Visit (INDEPENDENT_AMBULATORY_CARE_PROVIDER_SITE_OTHER): Payer: Medicare Other | Admitting: Neurology

## 2019-02-19 VITALS — BP 108/68 | HR 60 | Ht 70.0 in | Wt 247.0 lb

## 2019-02-19 DIAGNOSIS — D329 Benign neoplasm of meninges, unspecified: Secondary | ICD-10-CM | POA: Diagnosis not present

## 2019-02-19 DIAGNOSIS — G20 Parkinson's disease: Secondary | ICD-10-CM

## 2019-02-19 DIAGNOSIS — G4752 REM sleep behavior disorder: Secondary | ICD-10-CM

## 2019-02-19 MED ORDER — CLONAZEPAM 0.5 MG PO TABS: 0.2500 mg | ORAL_TABLET | Freq: Every day | ORAL | 1 refills | Status: DC

## 2019-02-19 NOTE — Patient Instructions (Signed)
1.  Start klonopin - 0.5 mg - 1/2 tablet at night 2.  Stop tylenol PM

## 2019-02-21 DIAGNOSIS — Z7901 Long term (current) use of anticoagulants: Secondary | ICD-10-CM | POA: Diagnosis not present

## 2019-02-21 DIAGNOSIS — I10 Essential (primary) hypertension: Secondary | ICD-10-CM | POA: Diagnosis not present

## 2019-02-21 DIAGNOSIS — I359 Nonrheumatic aortic valve disorder, unspecified: Secondary | ICD-10-CM | POA: Diagnosis not present

## 2019-03-15 ENCOUNTER — Ambulatory Visit (INDEPENDENT_AMBULATORY_CARE_PROVIDER_SITE_OTHER): Payer: Medicare Other | Admitting: Endocrinology

## 2019-03-15 ENCOUNTER — Other Ambulatory Visit: Payer: Self-pay

## 2019-03-15 DIAGNOSIS — Z794 Long term (current) use of insulin: Secondary | ICD-10-CM | POA: Diagnosis not present

## 2019-03-15 DIAGNOSIS — E1159 Type 2 diabetes mellitus with other circulatory complications: Secondary | ICD-10-CM | POA: Diagnosis not present

## 2019-03-15 DIAGNOSIS — E0800 Diabetes mellitus due to underlying condition with hyperosmolarity without nonketotic hyperglycemic-hyperosmolar coma (NKHHC): Secondary | ICD-10-CM

## 2019-03-15 NOTE — Patient Instructions (Addendum)
check your blood sugar 4 times a day: before the 3 meals, and at bedtime.  also check if you have symptoms of your blood sugar being too high or too low.  please keep a record of the readings and bring it to your next appointment here (or you can bring the meter itself).  You can write it on any piece of paper.  please call us sooner if your blood sugar goes below 70, or if you have a lot of readings over 200.  Please continue these pump settings:  basal rate of 2.2 units/HR, 7 AM-10 PM, and 1.1 units/HR 10PM-7AM.   No mealtime bolus.  correction bolus (which some people call "sensitivity," or "insulin sensitivity ratio," or just "isr") of 1 unit for each 25 by which your glucose exceeds 100.  For activity, suspend pump x 1-2 hrs. On this type of insulin pump schedule, you should eat meals on a regular schedule.  If a meal is missed or significantly delayed, your blood sugar could go low.  Please come back for a follow-up appointment in 3 months.

## 2019-03-15 NOTE — Progress Notes (Signed)
Subjective:    Patient ID: Bradley Berger, male    DOB: 1951/07/31, 68 y.o.   MRN: [id number]  HPI  Phone visit today, x 10 minutes:  Pt returns for f/u of diabetes mellitus: DM type: Insulin-requiring type 2.  Dx'ed: 7124 Complications: polyneuropathy and CAD.   Therapy: insulin since 1999 DKA: never Severe hypoglycemia: never Pancreatitis: never Other: he uses a medtronic paradigm pump; we stopped mealtime boluses, as pt was forgetting them.    Interval history:  He takes these pump settings:   basal rate of 2.2 units/HR, 7 AM-10 PM, and 1.1 units/HR 10PM-7AM.   No mealtime bolus.  correction bolus (which some people call "sensitivity," or "insulin sensitivity ratio," or just "isr") of 1 unit for each 25 by which your glucose exceeds 100.  For activity, he suspends pump x 1-2 hrs.   Pt says cbg varies from 68-218.  It is usually lowest with exercise.  However, It is in general higher as the day goes on.  pt states he feels well in general. Past Medical History:  Diagnosis Date  . Diabetes ([place]) 1997   Last A1C 6.2  . HTN (hypertension)   . Meningioma ([place])   . Parkinson disease ([place])   . S/P aortic valve replacement   . Sudden hearing loss    L ear    Past Surgical History:  Procedure Laterality Date  . BACK SURGERY    . CARDIAC SURGERY    . CATARACT EXTRACTION, BILATERAL    . HEMORROIDECTOMY    . KNEE SURGERY Left     Social History   Socioeconomic History  . Marital status: Married    Spouse name: Not on file  . Number of children: Not on file  . Years of education: Not on file  . Highest education level: Not on file  Occupational History  . Not on file  Social Needs  . Financial resource strain: Not on file  . Food insecurity:    Worry: Not on file    Inability: Not on file  . Transportation needs:    Medical: Not on file    Non-medical: Not on file  Tobacco Use  . Smoking status: Never Smoker  . Smokeless tobacco: Never Used  Substance and  Sexual Activity  . Alcohol use: Yes    Alcohol/week: 0.0 standard drinks    Comment: very rare  . Drug use: No  . Sexual activity: Not on file  Lifestyle  . Physical activity:    Days per week: Not on file    Minutes per session: Not on file  . Stress: Not on file  Relationships  . Social connections:    Talks on phone: Not on file    Gets together: Not on file    Attends religious service: Not on file    Active member of club or organization: Not on file    Attends meetings of clubs or organizations: Not on file    Relationship status: Not on file  . Intimate partner violence:    Fear of current or ex partner: Not on file    Emotionally abused: Not on file    Physically abused: Not on file    Forced sexual activity: Not on file  Other Topics Concern  . Not on file  Social History Narrative  . Not on file    Current Outpatient Medications on File Prior to Visit  Medication Sig Dispense Refill  . aspirin EC 81 MG tablet Take 81  mg by mouth daily.     . Cholecalciferol (VITAMIN D3) 5000 units CAPS Take 5,000 Units by mouth every morning.     . clonazePAM (KLONOPIN) 0.5 MG tablet Take 0.5 tablets (0.25 mg total) by mouth at bedtime. 45 tablet 1  . famotidine (PEPCID) 10 MG tablet Take 10 mg by mouth every morning.     . gabapentin (NEURONTIN) 600 MG tablet Take 600 mg by mouth daily.     . insulin glulisine (APIDRA) 100 UNIT/ML injection FOR USE IN PUMP FOR A TOTAL OF 60 UNITS A DAY; E11.9 60 mL 3  . Insulin Infusion Pump Supplies (MINIMED INFUSION SET-MMT 397) MISC 1 Device by Does not apply route every 3 (three) days.    . Insulin Infusion Pump Supplies (MINIMED RESERVOIR 3ML) MISC 1 Device by Does not apply route every 3 (three) days.    Marland Kitchen lisinopril (PRINIVIL,ZESTRIL) 2.5 MG tablet Take by mouth.    . loratadine (CLARITIN) 10 MG tablet Take by mouth.    . metoprolol tartrate (LOPRESSOR) 25 MG tablet Take by mouth 2 (two) times daily.     . Multiple Vitamin (MULTIVITAMIN)  tablet Take 1 tablet by mouth daily.    Marland Kitchen warfarin (COUMADIN) 4 MG tablet Take by mouth.    . valACYclovir (VALTREX) 1000 MG tablet Take 1,000 mg by mouth 3 (three) times daily.  0   No current facility-administered medications on file prior to visit.     Allergies  Allergen Reactions  . Dilaudid [Hydromorphone Hcl] Other (See Comments)    "flushing"  . Morphine Other (See Comments)    "flushing"    Family History  Problem Relation Age of Onset  . Lung cancer Mother     Review of Systems Denies LOC.     Objective:   Physical Exam       Assessment & Plan:  Insulin-requiring type 2 DM, with CAD: uncertain glycemia control.  He declines a1c today  Patient Instructions  check your blood sugar 4 times a day: before the 3 meals, and at bedtime.  also check if you have symptoms of your blood sugar being too high or too low.  please keep a record of the readings and bring it to your next appointment here (or you can bring the meter itself).  You can write it on any piece of paper.  please call us sooner if your blood sugar goes below 70, or if you have a lot of readings over 200.  Please continue these pump settings:  basal rate of 2.2 units/HR, 7 AM-10 PM, and 1.1 units/HR 10PM-7AM.   No mealtime bolus.  correction bolus (which some people call "sensitivity," or "insulin sensitivity ratio," or just "isr") of 1 unit for each 25 by which your glucose exceeds 100.  For activity, suspend pump x 1-2 hrs. On this type of insulin pump schedule, you should eat meals on a regular schedule.  If a meal is missed or significantly delayed, your blood sugar could go low.  Please come back for a follow-up appointment in 3 months.

## 2019-03-22 DIAGNOSIS — I10 Essential (primary) hypertension: Secondary | ICD-10-CM | POA: Diagnosis not present

## 2019-03-22 DIAGNOSIS — Z7901 Long term (current) use of anticoagulants: Secondary | ICD-10-CM | POA: Diagnosis not present

## 2019-03-22 DIAGNOSIS — I359 Nonrheumatic aortic valve disorder, unspecified: Secondary | ICD-10-CM | POA: Diagnosis not present

## 2019-03-31 ENCOUNTER — Other Ambulatory Visit: Payer: Self-pay | Admitting: Endocrinology

## 2019-03-31 DIAGNOSIS — E0800 Diabetes mellitus due to underlying condition with hyperosmolarity without nonketotic hyperglycemic-hyperosmolar coma (NKHHC): Secondary | ICD-10-CM

## 2019-03-31 DIAGNOSIS — Z794 Long term (current) use of insulin: Secondary | ICD-10-CM

## 2019-04-11 DIAGNOSIS — S00431A Contusion of right ear, initial encounter: Secondary | ICD-10-CM | POA: Diagnosis not present

## 2019-04-11 DIAGNOSIS — Z952 Presence of prosthetic heart valve: Secondary | ICD-10-CM | POA: Diagnosis not present

## 2019-04-11 DIAGNOSIS — H903 Sensorineural hearing loss, bilateral: Secondary | ICD-10-CM | POA: Diagnosis not present

## 2019-04-19 DIAGNOSIS — S00439A Contusion of unspecified ear, initial encounter: Secondary | ICD-10-CM | POA: Diagnosis not present

## 2019-04-19 DIAGNOSIS — Z7901 Long term (current) use of anticoagulants: Secondary | ICD-10-CM | POA: Diagnosis not present

## 2019-04-19 DIAGNOSIS — I10 Essential (primary) hypertension: Secondary | ICD-10-CM | POA: Diagnosis not present

## 2019-04-19 DIAGNOSIS — I359 Nonrheumatic aortic valve disorder, unspecified: Secondary | ICD-10-CM | POA: Diagnosis not present

## 2019-04-25 DIAGNOSIS — H60311 Diffuse otitis externa, right ear: Secondary | ICD-10-CM | POA: Diagnosis not present

## 2019-04-25 DIAGNOSIS — S00431A Contusion of right ear, initial encounter: Secondary | ICD-10-CM | POA: Diagnosis not present

## 2019-05-10 DIAGNOSIS — Z7901 Long term (current) use of anticoagulants: Secondary | ICD-10-CM | POA: Diagnosis not present

## 2019-05-10 DIAGNOSIS — I359 Nonrheumatic aortic valve disorder, unspecified: Secondary | ICD-10-CM | POA: Diagnosis not present

## 2019-05-10 DIAGNOSIS — I10 Essential (primary) hypertension: Secondary | ICD-10-CM | POA: Diagnosis not present

## 2019-06-18 DIAGNOSIS — I359 Nonrheumatic aortic valve disorder, unspecified: Secondary | ICD-10-CM | POA: Diagnosis not present

## 2019-06-18 DIAGNOSIS — Z7901 Long term (current) use of anticoagulants: Secondary | ICD-10-CM | POA: Diagnosis not present

## 2019-06-18 DIAGNOSIS — I10 Essential (primary) hypertension: Secondary | ICD-10-CM | POA: Diagnosis not present

## 2019-06-26 ENCOUNTER — Other Ambulatory Visit: Payer: Self-pay

## 2019-06-28 ENCOUNTER — Ambulatory Visit (INDEPENDENT_AMBULATORY_CARE_PROVIDER_SITE_OTHER): Payer: Medicare Other | Admitting: Endocrinology

## 2019-06-28 ENCOUNTER — Encounter: Payer: Self-pay | Admitting: Endocrinology

## 2019-06-28 ENCOUNTER — Other Ambulatory Visit: Payer: Self-pay

## 2019-06-28 VITALS — BP 128/60 | HR 64 | Ht 70.0 in | Wt 240.0 lb

## 2019-06-28 DIAGNOSIS — Z794 Long term (current) use of insulin: Secondary | ICD-10-CM | POA: Diagnosis not present

## 2019-06-28 DIAGNOSIS — Z9114 Patient's other noncompliance with medication regimen: Secondary | ICD-10-CM

## 2019-06-28 DIAGNOSIS — E0800 Diabetes mellitus due to underlying condition with hyperosmolarity without nonketotic hyperglycemic-hyperosmolar coma (NKHHC): Secondary | ICD-10-CM

## 2019-06-28 DIAGNOSIS — E1159 Type 2 diabetes mellitus with other circulatory complications: Secondary | ICD-10-CM | POA: Diagnosis not present

## 2019-06-28 LAB — POCT GLYCOSYLATED HEMOGLOBIN (HGB A1C): Hemoglobin A1C: 6.8 % — AB (ref 4.0–5.6)

## 2019-06-28 NOTE — Progress Notes (Signed)
e

## 2019-06-28 NOTE — Patient Instructions (Addendum)
check your blood sugar 4 times a day: before the 3 meals, and at bedtime.  also check if you have symptoms of your blood sugar being too high or too low.  please keep a record of the readings and bring it to your next appointment here (or you can bring the meter itself).  You can write it on any piece of paper.  please call us sooner if your blood sugar goes below 70, or if you have a lot of readings over 200.  Please continue these pump settings:  basal rate of 2.2 units/HR, 7 AM-10 PM, and 1.0 units/HR 10PM-7AM.   No mealtime bolus.  correction bolus (which some people call "sensitivity," or "insulin sensitivity ratio," or just "isr") of 1 unit for each 25 by which your glucose exceeds 100.  For activity, suspend pump x 1-2 hrs. On this type of insulin pump schedule, you should eat meals on a regular schedule.  If a meal is missed or significantly delayed, your blood sugar could go low.  Please come back for a follow-up appointment in 3 months.

## 2019-06-28 NOTE — Progress Notes (Signed)
Subjective:    Patient ID: Bradley Berger, male    DOB: 10/06/1951, 68 y.o.   MRN: [id number]  HPI Pt returns for f/u of diabetes mellitus: DM type: Insulin-requiring type 2.  Dx'ed: 7654 Complications: polyneuropathy and CAD.   Therapy: insulin since 1999 DKA: never Severe hypoglycemia: never Pancreatitis: never Other: he uses a medtronic paradigm pump; we stopped mealtime boluses, as pt was forgetting them.    Interval history:  He takes these pump settings:   basal rate of 2.2 units/HR, 7 AM-10 PM, and 1.1 units/HR 10PM-7AM.   No mealtime bolus.  correction bolus (which some people call "sensitivity," or "insulin sensitivity ratio," or just "isr") of 1 unit for each 25 by which your glucose exceeds 100.  For activity, he suspends pump x 1-2 hrs. He brings a record of his cbg's which I have reviewed today.  cbg varies from 65-157.  There is no trend throughout the day.  pt states he feels well in general.  Past Medical History:  Diagnosis Date  . Diabetes ([place]) 1997   Last A1C 6.2  . HTN (hypertension)   . Meningioma ([place])   . Parkinson disease ([place])   . S/P aortic valve replacement   . Sudden hearing loss    L ear    Past Surgical History:  Procedure Laterality Date  . BACK SURGERY    . CARDIAC SURGERY    . CATARACT EXTRACTION, BILATERAL    . HEMORROIDECTOMY    . KNEE SURGERY Left     Social History   Socioeconomic History  . Marital status: Married    Spouse name: Not on file  . Number of children: Not on file  . Years of education: Not on file  . Highest education level: Not on file  Occupational History  . Not on file  Social Needs  . Financial resource strain: Not on file  . Food insecurity    Worry: Not on file    Inability: Not on file  . Transportation needs    Medical: Not on file    Non-medical: Not on file  Tobacco Use  . Smoking status: Never Smoker  . Smokeless tobacco: Never Used  Substance and Sexual Activity  . Alcohol use: Yes   Alcohol/week: 0.0 standard drinks    Comment: very rare  . Drug use: No  . Sexual activity: Not on file  Lifestyle  . Physical activity    Days per week: Not on file    Minutes per session: Not on file  . Stress: Not on file  Relationships  . Social Herbalist on phone: Not on file    Gets together: Not on file    Attends religious service: Not on file    Active member of club or organization: Not on file    Attends meetings of clubs or organizations: Not on file    Relationship status: Not on file  . Intimate partner violence    Fear of current or ex partner: Not on file    Emotionally abused: Not on file    Physically abused: Not on file    Forced sexual activity: Not on file  Other Topics Concern  . Not on file  Social History Narrative  . Not on file    Current Outpatient Medications on File Prior to Visit  Medication Sig Dispense Refill  . aspirin EC 81 MG tablet Take 81 mg by mouth daily.     . Cholecalciferol (VITAMIN  D3) 5000 units CAPS Take 5,000 Units by mouth every morning.     . clonazePAM (KLONOPIN) 0.5 MG tablet Take 0.5 tablets (0.25 mg total) by mouth at bedtime. 45 tablet 1  . famotidine (PEPCID) 10 MG tablet Take 10 mg by mouth every morning.     . gabapentin (NEURONTIN) 600 MG tablet Take 600 mg by mouth daily.     . insulin glulisine (APIDRA) 100 UNIT/ML injection FOR USE IN PUMP FOR A TOTAL OF 60 UNITS SUBCUTANEOUSLY ONCE DAILY 60 mL 0  . Insulin Infusion Pump Supplies (MINIMED INFUSION SET-MMT 397) MISC 1 Device by Does not apply route every 3 (three) days.    . Insulin Infusion Pump Supplies (MINIMED RESERVOIR 3ML) MISC 1 Device by Does not apply route every 3 (three) days.    Marland Kitchen lisinopril (PRINIVIL,ZESTRIL) 2.5 MG tablet Take by mouth.    . loratadine (CLARITIN) 10 MG tablet Take by mouth.    . metoprolol tartrate (LOPRESSOR) 25 MG tablet Take by mouth 2 (two) times daily.     . Multiple Vitamin (MULTIVITAMIN) tablet Take 1 tablet by mouth  daily.    . valACYclovir (VALTREX) 1000 MG tablet Take 1,000 mg by mouth 3 (three) times daily.  0  . warfarin (COUMADIN) 4 MG tablet Take by mouth.     No current facility-administered medications on file prior to visit.     Allergies  Allergen Reactions  . Dilaudid [Hydromorphone Hcl] Other (See Comments)    "flushing"  . Morphine Other (See Comments)    "flushing"    Family History  Problem Relation Age of Onset  . Lung cancer Mother     BP 128/60 (BP Location: Left Arm, Patient Position: Sitting, Cuff Size: Large)   Pulse 64   Ht 5\' 10"  (1.778 m)   Wt 240 lb (108.9 kg)   SpO2 95%   BMI 34.44 kg/m   Review of Systems Denies LOC    Objective:   Physical Exam VITAL SIGNS:  See vs page GENERAL: no distress Pulses: foot pulses are intact bilaterally.   MSK: no deformity of the feet or ankles.  CV: 2+ bilat edema of the legs, and bilat vv's.  Skin:  no ulcer on the feet or ankles, but there are heavy calluses.  normal color and temp on the feet and ankles.  Neuro: sensation is intact to touch on the feet and ankles.   Ext: There is bilateral onychomycosis of the toenails.     Lab Results  Component Value Date   HGBA1C 6.8 (A) 06/28/2019       Assessment & Plan:  Insulin-requiring type 2 DM, with CAD: overcontrolled, this is the best control this pt should aim for, given this pump regimen, which does match insulin to his changing needs throughout the day Hypoglycemia: this limits aggressiveness of glycemic control Noncompliance with boluses: he has done better with replacing them with a higher daytime basal than at night.  Patient Instructions  check your blood sugar 4 times a day: before the 3 meals, and at bedtime.  also check if you have symptoms of your blood sugar being too high or too low.  please keep a record of the readings and bring it to your next appointment here (or you can bring the meter itself).  You can write it on any piece of paper.  please call  us sooner if your blood sugar goes below 70, or if you have a lot of readings over 200.  Please  continue these pump settings:  basal rate of 2.2 units/HR, 7 AM-10 PM, and 1.0 units/HR 10PM-7AM.   No mealtime bolus.  correction bolus (which some people call "sensitivity," or "insulin sensitivity ratio," or just "isr") of 1 unit for each 25 by which your glucose exceeds 100.  For activity, suspend pump x 1-2 hrs. On this type of insulin pump schedule, you should eat meals on a regular schedule.  If a meal is missed or significantly delayed, your blood sugar could go low.  Please come back for a follow-up appointment in 3 months.

## 2019-07-25 DIAGNOSIS — I10 Essential (primary) hypertension: Secondary | ICD-10-CM | POA: Diagnosis not present

## 2019-07-25 DIAGNOSIS — E785 Hyperlipidemia, unspecified: Secondary | ICD-10-CM | POA: Diagnosis not present

## 2019-07-25 DIAGNOSIS — Z7901 Long term (current) use of anticoagulants: Secondary | ICD-10-CM | POA: Diagnosis not present

## 2019-07-25 DIAGNOSIS — I359 Nonrheumatic aortic valve disorder, unspecified: Secondary | ICD-10-CM | POA: Diagnosis not present

## 2019-07-25 DIAGNOSIS — I2510 Atherosclerotic heart disease of native coronary artery without angina pectoris: Secondary | ICD-10-CM | POA: Diagnosis not present

## 2019-07-25 DIAGNOSIS — I872 Venous insufficiency (chronic) (peripheral): Secondary | ICD-10-CM | POA: Diagnosis not present

## 2019-07-31 NOTE — Progress Notes (Signed)
Subjective:   Bradley Berger was seen in consultation in the movement disorder clinic at the request of [name], [name], MD.  The evaluation is for tremor.  The patient has previously seen a neurologist but I don't have any of those records.  I did review records from his PCP.    This patient is accompanied in the office by his spouse who supplements the history.   The patient is a 68 y.o. right handed male with a history of tremor.  Tremor has been present for 1-1.5 years and is located in both hands, but the right seems worse.  It is intermittent.  It is worse when BS is low.  He saw the neurologist in [place] neurologist about a year ago and was started on primidone and he took that for about 3 months.  He was told he needed to d/c that when he started the [facility] and he is now disease free.  He is on gabapentin 600 mg but that is for neuropathy and takes that q hs.  There is no family hx of tremor.  Pt admits that he had a friend who came here who was previously dx with "tremor" and ultimately had PD.    Affected by caffeine:  No. (doesn't drink coffee) Affected by alcohol:  unknown (only drinks 12 pack beer per year) Affected by stress:  Yes.   Affected by fatigue:  Yes.   Spills soup if on spoon:  no Spills glass of liquid if full:  No. Affects ADL's (tying shoes, brushing teeth, etc):  No.  Current/Previously tried tremor medications: primidone (taken off due to starting harvoni for hep c)  Current medications that may exacerbate tremor:  n/a  Outside reports reviewed: historical medical records and office notes.  02/07/17 update:  Patient follows up today, accompanied by his wife who supplements the history.  Patient has a history of mild resting tremor and I last saw him in July, 2017.  The patient states that it has stayed the same but wife states that it has gotten a little worse.  Most noticeable when holding a book or if tired.  He is R hand dominant and R hand may  shake more than the L.  No falls.  Not exercising to any significant degree.  Takes 30 min nap in afternoon.  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  No records are available.  With one of the vertigo epsiodes, he had a syncopal episode.   He went to the hospital and was dx with "a small brain tumor" per wife.  I was able to obtain this per wife and he was dx with a paraclinoid meningioma.  Hearing loss started to return and was uncomfortable as he heard loud "static" in the ear.  That is better but hearing regressed again.  08/18/17 update:  Patient seen today in follow-up for his tremor.  He is accompanied by his wife who supplements the history.  His tremor has been stable per patient but wife states its a little worse.  Pt will notice it when fatigued.  No falls.  He is doing water aerobics for exercise.  MRI was done in March, 2018 to evaluate his known meningioma.  It is in close proximity to the right ICA terminus.  Because of this,  he was sent for an evaluation with Dr. Vertell Limber.  I received a note from him dated 03/28/2017.  They're just going to follow this and repeat the scan in one year per his notes.  The patient states, however, that he has an appointment with him in October and thinks he has a repeat scan before that.  02/16/18 update: Patient is following up today for his tremor.  He is accompanied by his daughter who supplements the history.  He has been exercising "when I can."  He has exercised less over the last month (was 2-3 days per week).    He has had no falls.  No lightheadedness or near syncope.  Tremor, at times, is worse and sometimes it is better.  Daughter notes its worse with fatigue.  Records have been reviewed since last visit.  He has seen Dr. Loanne Drilling for his diabetes.  He was in the emergency room in September for Bell's palsy.  It lasted  about 2 weeks.  02/19/19 update:  Pt seen in f/u for tremor.  This patient is accompanied in the office by his wife who supplements the history.  He has better and worse times with tremor.  Wife notes some head tremor.  Some tremor with writing.  He is more concerned that he has PD.  He is dreaming more at night.  One night wife woke up and his hand was right over her face.  Pt denies falls.  Pt denies lightheadedness, near syncope.  No hallucinations.  Mood has been good.  Not exercising except for walking the dog three times per day.  Quit going to [employer].  They ask about going to [facility].  Having more trouble getting up and down from the chair.  The records that were made available to me were reviewed.  Family called on 12/29/18 with "sick and a severely stiff neck." they apparently saw PCP before calling our office.  Once I got the message, I advised that needed to go to ER to r/o meningitis.  States today that pt just got better with time and with massage therapy.  He still has some limited mobility but much better than it was.  The records that were made available to me were reviewed, including endocrinology records.  08/02/19 update: Patient seen today in follow-up for his new diagnosis of early, mild Parkinson's disease.  He is on no medications.  Pt denies falls.  Pt denies lightheadedness, near syncope.  No hallucinations.  Mood has been good.  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  He is doing much better in that regard.  He needs a refill on that.  He is walking his dog for exercise.    Medical records have been reviewed since last visit, including his recent visit with Dr. Loanne Drilling on July 16.  Pt has hx of meningioma but states that he doesn't think that he has a f/u with Dr Vertell Limber  Allergies  Allergen Reactions  . Dilaudid [Hydromorphone Hcl] Other (See Comments)    "flushing"  . Morphine Other (See Comments)    "flushing"    Outpatient Encounter Medications as of 08/02/2019   Medication Sig  . famotidine (PEPCID) 10 MG tablet Take 10 mg by mouth every morning.   . gabapentin (NEURONTIN) 600 MG tablet Take 600 mg by mouth daily.   . insulin glulisine (APIDRA) 100 UNIT/ML injection FOR USE IN PUMP FOR A TOTAL OF 60 UNITS SUBCUTANEOUSLY ONCE DAILY  . Insulin  Infusion Pump Supplies (MINIMED INFUSION SET-MMT 397) MISC 1 Device by Does not apply route every 3 (three) days.  . Insulin Infusion Pump Supplies (MINIMED RESERVOIR 3ML) MISC 1 Device by Does not apply route every 3 (three) days.  Marland Kitchen lisinopril (PRINIVIL,ZESTRIL) 2.5 MG tablet Take by mouth.  . loratadine (CLARITIN) 10 MG tablet Take by mouth.  . metoprolol tartrate (LOPRESSOR) 25 MG tablet Take by mouth 2 (two) times daily.   . Multiple Vitamin (MULTIVITAMIN) tablet Take 1 tablet by mouth daily.  Marland Kitchen warfarin (COUMADIN) 4 MG tablet Take by mouth.  Marland Kitchen aspirin EC 81 MG tablet Take 81 mg by mouth daily.   . Cholecalciferol (VITAMIN D3) 5000 units CAPS Take 5,000 Units by mouth every morning.   . clonazePAM (KLONOPIN) 0.5 MG tablet Take 0.5 tablets (0.25 mg total) by mouth at bedtime.  . [DISCONTINUED] valACYclovir (VALTREX) 1000 MG tablet Take 1,000 mg by mouth 3 (three) times daily.   No facility-administered encounter medications on file as of 08/02/2019.     Past Medical History:  Diagnosis Date  . Diabetes ([place]) 1997   Last A1C 6.2  . HTN (hypertension)   . Meningioma ([place])   . Parkinson disease ([place])   . S/P aortic valve replacement   . Sudden hearing loss    L ear    Past Surgical History:  Procedure Laterality Date  . BACK SURGERY    . CARDIAC SURGERY    . CATARACT EXTRACTION, BILATERAL    . HEMORROIDECTOMY    . KNEE SURGERY Left     Social History   Socioeconomic History  . Marital status: Married    Spouse name: Not on file  . Number of children: Not on file  . Years of education: Not on file  . Highest education level: Not on file  Occupational History  . Not on file  Social Needs   . Financial resource strain: Not on file  . Food insecurity    Worry: Not on file    Inability: Not on file  . Transportation needs    Medical: Not on file    Non-medical: Not on file  Tobacco Use  . Smoking status: Never Smoker  . Smokeless tobacco: Never Used  Substance and Sexual Activity  . Alcohol use: Yes    Alcohol/week: 0.0 standard drinks    Comment: very rare  . Drug use: No  . Sexual activity: Not on file  Lifestyle  . Physical activity    Days per week: Not on file    Minutes per session: Not on file  . Stress: Not on file  Relationships  . Social Herbalist on phone: Not on file    Gets together: Not on file    Attends religious service: Not on file    Active member of club or organization: Not on file    Attends meetings of clubs or organizations: Not on file    Relationship status: Not on file  . Intimate partner violence    Fear of current or ex partner: Not on file    Emotionally abused: Not on file    Physically abused: Not on file    Forced sexual activity: Not on file  Other Topics Concern  . Not on file  Social History Narrative  . Not on file    Family Status  Relation Name Status  . Mother  Deceased  . Father  Deceased       accident  .  Sister  Alive  . Sister  Alive    Review of Systems Review of Systems  Constitutional: Negative.   HENT: Negative.   Eyes: Negative.   Respiratory: Negative.   Cardiovascular: Negative.   Gastrointestinal: Negative.   Genitourinary: Negative.   Skin: Negative.   Endo/Heme/Allergies: Negative.       Objective:   VITALS:   Vitals:   08/02/19 1047  BP: (!) 112/58  Pulse: 64  SpO2: 96%  Weight: 238 lb (108 kg)  Height: 5\' 10"  (1.778 m)    Gen:  Appears stated age and in NAD. HEENT:  Normocephalic, atraumatic. The mucous membranes are moist. The superficial temporal arteries are without ropiness or tenderness. Cardiovascular: Regular rate and rhythm. Lungs: Clear to auscultation  bilaterally. Neck: There are no carotid bruits noted bilaterally.  NEUROLOGICAL:  Orientation:  The patient is alert and oriented x 3.   Cranial nerves: There is good facial symmetry.  Extraocular muscles are intact and visual fields are full to confrontational testing. Speech is fluent and clear. Soft palate rises symmetrically and there is no tongue deviation. Hearing is intact to conversational tone. Sensation: Sensation is intact to light touch throughout Motor: Strength is 5/5 in the bilateral upper and lower extremities.  Shoulder shrug is equal bilaterally.  There is no pronator drift.  There are no fasciculations noted.  Movement examination: Tone: There is normal tone in the RUE.  There is normal tone in the left upper extremity today.  There is mild increase in tone in the left lower extremity.  Tone is normal in the right lower extremity. Abnormal movements: There is a rare LUE rest tremor Coordination:  There is decremation, with any form of RAMS, including alternating supination and pronation of the forearm, hand opening and closing, finger taps, heel taps and toe taps on the L, but very mild Gait and Station:   The patient arises easily out of the chair without the use of his hands.  He has foot drop on the L.  He walks well on the hall with good swing.  He has a neg pull test.   LABS  TSH done 03/24/16 2.83  Lab Results  Component Value Date   HGBA1C 6.8 (A) 06/28/2019        Assessment/Plan:   1.  Probable early mild Parkinsons disease  -he feels well off of medication and we decided to hold off on medication.  -make appt with Dermatology in [place].  Discussed relationship between melanoma and PD.  Pt expressed understanding.    2.  L foot drop  -probably due to L5-S1 radiculopathy and he is s/p surgical intervention and no longer feels pain.    3.  Diabetic peripheral neuropathy  -talked about safety.  Is the likely etiology for balance issues.    4.   Paracliniod meningioma  -He was told to make appt with Dr. Vertell Limber.  Was supposed to f/u yearly and doesn't think that he has an appointment.  Last appt was 06/2018  5.   REM behavior disorder  -doing better with klonopin, 0.5 mg, 1/2 tablet at bedtime.  Risks, benefits, side effects and alternative therapies were discussed.  The opportunity to ask questions was given and they were answered to the best of my ability.  The patient expressed understanding and willingness to follow the outlined treatment protocols.  6.  Follow up is anticipated in the next 6 months, sooner should new neurologic issues arise.  Much greater than 50% of this  visit was spent in counseling and coordinating care.  Total face to face time:  25 min

## 2019-08-02 ENCOUNTER — Ambulatory Visit (INDEPENDENT_AMBULATORY_CARE_PROVIDER_SITE_OTHER): Payer: Medicare Other | Admitting: Neurology

## 2019-08-02 ENCOUNTER — Telehealth: Payer: Self-pay | Admitting: Endocrinology

## 2019-08-02 ENCOUNTER — Encounter: Payer: Self-pay | Admitting: Neurology

## 2019-08-02 ENCOUNTER — Other Ambulatory Visit: Payer: Self-pay

## 2019-08-02 VITALS — BP 112/58 | HR 64 | Ht 70.0 in | Wt 238.0 lb

## 2019-08-02 DIAGNOSIS — G4752 REM sleep behavior disorder: Secondary | ICD-10-CM | POA: Diagnosis not present

## 2019-08-02 DIAGNOSIS — G20 Parkinson's disease: Secondary | ICD-10-CM | POA: Diagnosis not present

## 2019-08-02 MED ORDER — CLONAZEPAM 0.5 MG PO TABS: 0.2500 mg | ORAL_TABLET | Freq: Every day | ORAL | 1 refills | Status: DC

## 2019-08-02 NOTE — Patient Instructions (Signed)
1.  Make an appointment with Dr. Vertell Limber regarding your meningioma 2.  Make an appointment with your dermatologist in [place].  Parkinsons disease increases risk for melanoma 3.  I sent your RX for klonopin to your pharmacy.  We decided to hold off on additional medication today.  The physicians and staff at [facility] are committed to providing excellent care. You may receive a survey requesting feedback about your experience at our office. We strive to receive "very good" responses to the survey questions. If you feel that your experience would prevent you from giving the office a "very good " response, please contact our office to try to remedy the situation. We may be reached at [phone]. Thank you for taking the time out of your busy day to complete the survey.

## 2019-08-02 NOTE — Telephone Encounter (Signed)
MEDICATION: Contour Next Test Strips  PHARMACY:  [facility] 90 DAY SUPPLY : YES  IS PATIENT OUT OF MEDICATION:   IF NOT; HOW MUCH IS LEFT:   LAST APPOINTMENT DATE: @7 /16/2020  NEXT APPOINTMENT DATE:@10 /16/2020  DO WE HAVE YOUR PERMISSION TO LEAVE A DETAILED MESSAGE:  OTHER COMMENTS:  Patient states he goes through 3 boxes in a month  **Let patient know to contact pharmacy at the end of the day to make sure medication is ready. **  ** Please notify patient to allow 48-72 hours to process**  **Encourage patient to contact the pharmacy for refills or they can request refills through [facility]**

## 2019-08-03 ENCOUNTER — Other Ambulatory Visit: Payer: Self-pay

## 2019-08-03 DIAGNOSIS — E0800 Diabetes mellitus due to underlying condition with hyperosmolarity without nonketotic hyperglycemic-hyperosmolar coma (NKHHC): Secondary | ICD-10-CM

## 2019-08-03 DIAGNOSIS — Z794 Long term (current) use of insulin: Secondary | ICD-10-CM

## 2019-08-03 MED ORDER — CONTOUR NEXT TEST VI STRP: 1.0000 | ORAL_STRIP | Freq: Four times a day (QID) | 1 refills | Status: DC

## 2019-08-03 MED ORDER — APIDRA 100 UNIT/ML IJ SOLN: 60.0000 [IU] | Freq: Every day | INTRAMUSCULAR | 0 refills | Status: DC

## 2019-08-03 NOTE — Telephone Encounter (Signed)
glucose blood (CONTOUR NEXT TEST) test strip 200 each 1 08/03/2019    Sig - Route: 1 each by Other route 4 (four) times daily. Use to monitor glucose levels 4 times daily; E11.9 - Other   Sent to pharmacy as: glucose blood (CONTOUR NEXT TEST) test strip   E-Prescribing Status: Receipt confirmed by pharmacy (08/03/2019 7:49 AM EDT)

## 2019-08-22 DIAGNOSIS — I10 Essential (primary) hypertension: Secondary | ICD-10-CM | POA: Diagnosis not present

## 2019-08-22 DIAGNOSIS — Z7901 Long term (current) use of anticoagulants: Secondary | ICD-10-CM | POA: Diagnosis not present

## 2019-08-22 DIAGNOSIS — I359 Nonrheumatic aortic valve disorder, unspecified: Secondary | ICD-10-CM | POA: Diagnosis not present

## 2019-08-27 DIAGNOSIS — D329 Benign neoplasm of meninges, unspecified: Secondary | ICD-10-CM | POA: Diagnosis not present

## 2019-09-03 DIAGNOSIS — H905 Unspecified sensorineural hearing loss: Secondary | ICD-10-CM | POA: Diagnosis not present

## 2019-09-03 DIAGNOSIS — H9313 Tinnitus, bilateral: Secondary | ICD-10-CM | POA: Diagnosis not present

## 2019-09-12 DIAGNOSIS — J320 Chronic maxillary sinusitis: Secondary | ICD-10-CM | POA: Diagnosis not present

## 2019-09-12 DIAGNOSIS — R42 Dizziness and giddiness: Secondary | ICD-10-CM | POA: Diagnosis not present

## 2019-09-12 DIAGNOSIS — D329 Benign neoplasm of meninges, unspecified: Secondary | ICD-10-CM | POA: Diagnosis not present

## 2019-09-12 DIAGNOSIS — Z6835 Body mass index (BMI) 35.0-35.9, adult: Secondary | ICD-10-CM | POA: Diagnosis not present

## 2019-09-12 DIAGNOSIS — D320 Benign neoplasm of cerebral meninges: Secondary | ICD-10-CM | POA: Diagnosis not present

## 2019-09-19 DIAGNOSIS — I10 Essential (primary) hypertension: Secondary | ICD-10-CM | POA: Diagnosis not present

## 2019-09-19 DIAGNOSIS — I359 Nonrheumatic aortic valve disorder, unspecified: Secondary | ICD-10-CM | POA: Diagnosis not present

## 2019-09-19 DIAGNOSIS — Z7901 Long term (current) use of anticoagulants: Secondary | ICD-10-CM | POA: Diagnosis not present

## 2019-09-26 ENCOUNTER — Other Ambulatory Visit: Payer: Self-pay

## 2019-09-28 ENCOUNTER — Other Ambulatory Visit: Payer: Self-pay

## 2019-09-28 ENCOUNTER — Ambulatory Visit (INDEPENDENT_AMBULATORY_CARE_PROVIDER_SITE_OTHER): Payer: Medicare Other | Admitting: Endocrinology

## 2019-09-28 ENCOUNTER — Encounter: Payer: Self-pay | Admitting: Endocrinology

## 2019-09-28 VITALS — BP 142/70 | HR 64 | Ht 70.0 in | Wt 237.2 lb

## 2019-09-28 DIAGNOSIS — E1151 Type 2 diabetes mellitus with diabetic peripheral angiopathy without gangrene: Secondary | ICD-10-CM

## 2019-09-28 DIAGNOSIS — Z23 Encounter for immunization: Secondary | ICD-10-CM | POA: Diagnosis not present

## 2019-09-28 DIAGNOSIS — Z794 Long term (current) use of insulin: Secondary | ICD-10-CM

## 2019-09-28 DIAGNOSIS — E0800 Diabetes mellitus due to underlying condition with hyperosmolarity without nonketotic hyperglycemic-hyperosmolar coma (NKHHC): Secondary | ICD-10-CM

## 2019-09-28 LAB — POCT GLYCOSYLATED HEMOGLOBIN (HGB A1C): Hemoglobin A1C: 6.3 % — AB (ref 4.0–5.6)

## 2019-09-28 NOTE — Patient Instructions (Addendum)
check your blood sugar twice a day: before the 3 meals, and at bedtime.  also check if you have symptoms of your blood sugar being too high or too low.  please keep a record of the readings and bring it to your next appointment here (or you can bring the meter itself).  You can write it on any piece of paper.  please call us sooner if your blood sugar goes below 70, or if you have a lot of readings over 200.  Please take these pump settings:  basal rate of 2.1 units/HR, 7 AM-10 PM, and 0.9 units/HR 10PM-7AM.   No mealtime bolus.  correction bolus (which some people call "sensitivity," or "insulin sensitivity ratio," or just "isr") of 1 unit for each 25 by which your glucose exceeds 100.  For activity, suspend pump x 1-2 hrs. On this type of insulin pump schedule, you should eat meals on a regular schedule.  If a meal is missed or significantly delayed, your blood sugar could go low.  Please come back for a follow-up appointment in 3 months.

## 2019-09-28 NOTE — Progress Notes (Signed)
Subjective:    Patient ID: Bradley Berger, male    DOB: 05-07-1951, 68 y.o.   MRN: [id number]  HPI Pt returns for f/u of diabetes mellitus: DM type: Insulin-requiring type 2.  Dx'ed: 0000000 Complications: polyneuropathy and CAD.   Therapy: insulin since 1999 DKA: never Severe hypoglycemia: never Pancreatitis: never Other: he uses a medtronic paradigm pump; we stopped mealtime boluses, as pt was forgetting them.    Interval history:  He takes these pump settings:   basal rate of 2.2 units/HR, 7 AM-10 PM, and 1.0 units/HR 10PM-7AM.   No mealtime bolus.  correction bolus (which some people call "sensitivity," or "insulin sensitivity ratio," or just "isr") of 1 unit for each 25 by which your glucose exceeds 100.   For activity, he suspends pump x 1-2 hrs.   He brings a record of his cbg's which I have reviewed today.  cbg varies from 59-194.  There is no trend throughout the day.  pt states he feels well in general.   Past Medical History:  Diagnosis Date  . Diabetes ([place]) 1997   Last A1C 6.2  . HTN (hypertension)   . Meningioma ([place])   . Parkinson disease ([place])   . S/P aortic valve replacement   . Sudden hearing loss    L ear    Past Surgical History:  Procedure Laterality Date  . BACK SURGERY    . CARDIAC SURGERY    . CATARACT EXTRACTION, BILATERAL    . HEMORROIDECTOMY    . KNEE SURGERY Left     Social History   Socioeconomic History  . Marital status: Married    Spouse name: Not on file  . Number of children: 3  . Years of education: Not on file  . Highest education level: Associate degree: occupational, Hotel manager, or vocational program  Occupational History  . Not on file  Social Needs  . Financial resource strain: Not on file  . Food insecurity    Worry: Not on file    Inability: Not on file  . Transportation needs    Medical: Not on file    Non-medical: Not on file  Tobacco Use  . Smoking status: Never Smoker  . Smokeless tobacco: Never Used  Substance  and Sexual Activity  . Alcohol use: Yes    Alcohol/week: 1.0 standard drinks    Types: 1 Cans of beer per week    Comment: very rare  . Drug use: No  . Sexual activity: Not on file  Lifestyle  . Physical activity    Days per week: Not on file    Minutes per session: Not on file  . Stress: Not on file  Relationships  . Social Herbalist on phone: Not on file    Gets together: Not on file    Attends religious service: Not on file    Active member of club or organization: Not on file    Attends meetings of clubs or organizations: Not on file    Relationship status: Not on file  . Intimate partner violence    Fear of current or ex partner: Not on file    Emotionally abused: Not on file    Physically abused: Not on file    Forced sexual activity: Not on file  Other Topics Concern  . Not on file  Social History Narrative   Pt lives with his spouse Jana Half at home- 2 story home- he has 3 daughter   Right handed  Drinks tea, some soda, no coffee    Current Outpatient Medications on File Prior to Visit  Medication Sig Dispense Refill  . aspirin EC 81 MG tablet Take 81 mg by mouth daily.     . Cholecalciferol (VITAMIN D3) 5000 units CAPS Take 5,000 Units by mouth every morning.     . clonazePAM (KLONOPIN) 0.5 MG tablet Take 0.5 tablets (0.25 mg total) by mouth at bedtime. 45 tablet 1  . famotidine (PEPCID) 10 MG tablet Take 10 mg by mouth every morning.     . gabapentin (NEURONTIN) 600 MG tablet Take 600 mg by mouth daily.     Marland Kitchen glucose blood (CONTOUR NEXT TEST) test strip 1 each by Other route 4 (four) times daily. Use to monitor glucose levels 4 times daily; E11.9 200 each 1  . insulin glulisine (APIDRA) 100 UNIT/ML injection Inject 0.6 mLs (60 Units total) into the skin daily. FOR USE IN INSULIN PUMP; E11.9 60 mL 0  . Insulin Infusion Pump Supplies (MINIMED INFUSION SET-MMT 397) MISC 1 Device by Does not apply route every 3 (three) days.    . Insulin Infusion Pump  Supplies (MINIMED RESERVOIR 3ML) MISC 1 Device by Does not apply route every 3 (three) days.    Marland Kitchen lisinopril (PRINIVIL,ZESTRIL) 2.5 MG tablet Take by mouth.    . loratadine (CLARITIN) 10 MG tablet Take by mouth.    . metoprolol tartrate (LOPRESSOR) 25 MG tablet Take by mouth 2 (two) times daily.     . Multiple Vitamin (MULTIVITAMIN) tablet Take 1 tablet by mouth daily.    Marland Kitchen warfarin (COUMADIN) 4 MG tablet Take by mouth.     No current facility-administered medications on file prior to visit.     Allergies  Allergen Reactions  . Dilaudid [Hydromorphone Hcl] Other (See Comments)    "flushing"  . Morphine Other (See Comments)    "flushing"    Family History  Problem Relation Age of Onset  . Lung cancer Mother   . Healthy Sister   . Healthy Sister   . Healthy Daughter     BP (!) 142/70 (BP Location: Left Arm, Patient Position: Sitting, Cuff Size: Normal)   Pulse 64   Ht 5\' 10"  (1.778 m)   Wt 237 lb 3.2 oz (107.6 kg)   SpO2 96%   BMI 34.03 kg/m    Review of Systems Denies LOc    Objective:   Physical Exam VITAL SIGNS:  See vs page GENERAL: no distress Pulses: dorsalis pedis intact bilat.   MSK: no deformity of the feet CV: trace bilat leg edema, and bilat vv's Skin:  no ulcer on the feet, but there are heavy calluses.  normal color and temp on the feet. Neuro: sensation is intact to touch on the feet Ext: there is bilateral onychomycosis of the toenails.    Lab Results  Component Value Date   HGBA1C 6.3 (A) 09/28/2019       Assessment & Plan:  Insulin-requiring type 2 DM, with PAD: overcontrolled Hypoglycemia: this limits aggressiveness of glycemic control.  HTN: is noted today: recheck next time.    Patient Instructions  check your blood sugar twice a day: before the 3 meals, and at bedtime.  also check if you have symptoms of your blood sugar being too high or too low.  please keep a record of the readings and bring it to your next appointment here (or you  can bring the meter itself).  You can write it on any piece of  paper.  please call us sooner if your blood sugar goes below 70, or if you have a lot of readings over 200.  Please take these pump settings:  basal rate of 2.1 units/HR, 7 AM-10 PM, and 0.9 units/HR 10PM-7AM.   No mealtime bolus.  correction bolus (which some people call "sensitivity," or "insulin sensitivity ratio," or just "isr") of 1 unit for each 25 by which your glucose exceeds 100.  For activity, suspend pump x 1-2 hrs. On this type of insulin pump schedule, you should eat meals on a regular schedule.  If a meal is missed or significantly delayed, your blood sugar could go low.  Please come back for a follow-up appointment in 3 months.

## 2019-10-19 DIAGNOSIS — Z7901 Long term (current) use of anticoagulants: Secondary | ICD-10-CM | POA: Diagnosis not present

## 2019-10-19 DIAGNOSIS — I872 Venous insufficiency (chronic) (peripheral): Secondary | ICD-10-CM | POA: Diagnosis not present

## 2019-10-19 DIAGNOSIS — I359 Nonrheumatic aortic valve disorder, unspecified: Secondary | ICD-10-CM | POA: Diagnosis not present

## 2019-10-19 DIAGNOSIS — I10 Essential (primary) hypertension: Secondary | ICD-10-CM | POA: Diagnosis not present

## 2019-11-06 ENCOUNTER — Other Ambulatory Visit: Payer: Self-pay | Admitting: Endocrinology

## 2019-11-06 DIAGNOSIS — E0800 Diabetes mellitus due to underlying condition with hyperosmolarity without nonketotic hyperglycemic-hyperosmolar coma (NKHHC): Secondary | ICD-10-CM

## 2019-11-15 DIAGNOSIS — Z7901 Long term (current) use of anticoagulants: Secondary | ICD-10-CM | POA: Diagnosis not present

## 2019-11-15 DIAGNOSIS — I872 Venous insufficiency (chronic) (peripheral): Secondary | ICD-10-CM | POA: Diagnosis not present

## 2019-11-15 DIAGNOSIS — I359 Nonrheumatic aortic valve disorder, unspecified: Secondary | ICD-10-CM | POA: Diagnosis not present

## 2019-11-15 DIAGNOSIS — I10 Essential (primary) hypertension: Secondary | ICD-10-CM | POA: Diagnosis not present

## 2019-12-17 DIAGNOSIS — I359 Nonrheumatic aortic valve disorder, unspecified: Secondary | ICD-10-CM | POA: Diagnosis not present

## 2019-12-17 DIAGNOSIS — I10 Essential (primary) hypertension: Secondary | ICD-10-CM | POA: Diagnosis not present

## 2019-12-17 DIAGNOSIS — I872 Venous insufficiency (chronic) (peripheral): Secondary | ICD-10-CM | POA: Diagnosis not present

## 2019-12-17 DIAGNOSIS — Z7901 Long term (current) use of anticoagulants: Secondary | ICD-10-CM | POA: Diagnosis not present

## 2019-12-19 ENCOUNTER — Telehealth: Payer: Self-pay

## 2019-12-19 NOTE — Telephone Encounter (Signed)
Company: [employer]  Document: LMN for insulin pump and supplies Other records requested: None requested  All above requested information has been faxed successfully to [employer] listed above. Documents and fax confirmation have been placed in the faxed file for future reference.

## 2019-12-31 ENCOUNTER — Other Ambulatory Visit: Payer: Self-pay

## 2020-01-02 ENCOUNTER — Other Ambulatory Visit: Payer: Self-pay

## 2020-01-02 ENCOUNTER — Ambulatory Visit (INDEPENDENT_AMBULATORY_CARE_PROVIDER_SITE_OTHER): Payer: Medicare Other | Admitting: Endocrinology

## 2020-01-02 ENCOUNTER — Encounter: Payer: Self-pay | Admitting: Endocrinology

## 2020-01-02 VITALS — BP 100/60 | HR 73 | Ht 70.0 in | Wt 233.6 lb

## 2020-01-02 DIAGNOSIS — Z794 Long term (current) use of insulin: Secondary | ICD-10-CM | POA: Diagnosis not present

## 2020-01-02 DIAGNOSIS — E1159 Type 2 diabetes mellitus with other circulatory complications: Secondary | ICD-10-CM | POA: Diagnosis not present

## 2020-01-02 DIAGNOSIS — E0800 Diabetes mellitus due to underlying condition with hyperosmolarity without nonketotic hyperglycemic-hyperosmolar coma (NKHHC): Secondary | ICD-10-CM

## 2020-01-02 LAB — POCT GLYCOSYLATED HEMOGLOBIN (HGB A1C): Hemoglobin A1C: 7.3 % — AB (ref 4.0–5.6)

## 2020-01-02 NOTE — Patient Instructions (Addendum)
check your blood sugar twice a day: before the 3 meals, and at bedtime.  also check if you have symptoms of your blood sugar being too high or too low.  please keep a record of the readings and bring it to your next appointment here (or you can bring the meter itself).  You can write it on any piece of paper.  please call us sooner if your blood sugar goes below 70, or if you have a lot of readings over 200.  Please take these pump settings:  basal rate of 2.1 units/HR, 7 AM-10 PM, and 0.9 units/HR 10PM-7AM.   No mealtime bolus.  correction bolus (which some people call "sensitivity," or "insulin sensitivity ratio," or just "isr") of 1 unit for each 25 by which your glucose exceeds 100.  For activity, suspend pump x 1-2 hrs. On this type of insulin pump schedule, you should eat meals on a regular schedule.  If a meal is missed or significantly delayed, your blood sugar could go low.  Best wishes with your new doctor in [place].  If you don't get one soon, please come back for a follow-up appointment here in 3 months.

## 2020-01-02 NOTE — Progress Notes (Signed)
Subjective:    Patient ID: Bradley Berger, male    DOB: 10/25/1951, 69 y.o.   MRN: [id number]  HPI Pt returns for f/u of diabetes mellitus: DM type: Insulin-requiring type 2.  Dx'ed: 0000000 Complications: polyneuropathy and CAD.   Therapy: insulin since 1999 DKA: never Severe hypoglycemia: never Pancreatitis: never Other: he uses a medtronic paradigm pump; we stopped mealtime boluses, as pt was forgetting them; he declines continuous glucose monitor.     Interval history:  He takes these pump settings:   basal rate of 2.1 units/HR, 7 AM-10 PM, and 0.9 units/HR 10PM-7AM.   No mealtime bolus.  correction bolus (which some people call "sensitivity," or "insulin sensitivity ratio," or just "isr") of 1 unit for each 25 by which your glucose exceeds 100.  For activity, he suspends pump x 1-2 hrs. He brings a record of his cbg's which I have reviewed today.  cbg varies from 87-337.  It is in general higher as the day goes on, but not necessarily so.  pt states he feels well in general.  He has moved further [place] in [place].   Past Medical History:  Diagnosis Date  . Diabetes ([place]) 1997   Last A1C 6.2  . HTN (hypertension)   . Meningioma ([place])   . Parkinson disease ([place])   . S/P aortic valve replacement   . Sudden hearing loss    L ear    Past Surgical History:  Procedure Laterality Date  . BACK SURGERY    . CARDIAC SURGERY    . CATARACT EXTRACTION, BILATERAL    . HEMORROIDECTOMY    . KNEE SURGERY Left     Social History   Socioeconomic History  . Marital status: Married    Spouse name: Not on file  . Number of children: 3  . Years of education: Not on file  . Highest education level: Associate degree: occupational, Hotel manager, or vocational program  Occupational History  . Not on file  Tobacco Use  . Smoking status: Never Smoker  . Smokeless tobacco: Never Used  Substance and Sexual Activity  . Alcohol use: Yes    Alcohol/week: 1.0 standard drinks    Types: 1 Cans of  beer per week    Comment: very rare  . Drug use: No  . Sexual activity: Not on file  Other Topics Concern  . Not on file  Social History Narrative   Pt lives with his spouse Jana Half at home- 2 story home- he has 3 daughter   Right handed   Drinks tea, some soda, no coffee   Social Determinants of Health   Financial Resource Strain:   . Difficulty of Paying Living Expenses: Not on file  Food Insecurity:   . Worried About Charity fundraiser in the Last Year: Not on file  . Ran Out of Food in the Last Year: Not on file  Transportation Needs:   . Lack of Transportation (Medical): Not on file  . Lack of Transportation (Non-Medical): Not on file  Physical Activity:   . Days of Exercise per Week: Not on file  . Minutes of Exercise per Session: Not on file  Stress:   . Feeling of Stress : Not on file  Social Connections:   . Frequency of Communication with Friends and Family: Not on file  . Frequency of Social Gatherings with Friends and Family: Not on file  . Attends Religious Services: Not on file  . Active Member of Clubs or Organizations: Not on  file  . Attends Archivist Meetings: Not on file  . Marital Status: Not on file  Intimate Partner Violence:   . Fear of Current or Ex-Partner: Not on file  . Emotionally Abused: Not on file  . Physically Abused: Not on file  . Sexually Abused: Not on file    Current Outpatient Medications on File Prior to Visit  Medication Sig Dispense Refill  . APIDRA 100 UNIT/ML injection INJECT 60 UNITS SUBCUTANEOUSLY ONCE DAILY **FOR  USE  IN  INSULIN  PUMP** 60 mL 0  . aspirin EC 81 MG tablet Take 81 mg by mouth daily.     . Cholecalciferol (VITAMIN D3) 5000 units CAPS Take 5,000 Units by mouth every morning.     . clonazePAM (KLONOPIN) 0.5 MG tablet Take 0.5 tablets (0.25 mg total) by mouth at bedtime. 45 tablet 1  . famotidine (PEPCID) 10 MG tablet Take 10 mg by mouth every morning.     . gabapentin (NEURONTIN) 600 MG tablet Take  600 mg by mouth daily.     Marland Kitchen glucose blood (CONTOUR NEXT TEST) test strip 1 each by Other route 4 (four) times daily. Use to monitor glucose levels 4 times daily; E11.9 200 each 1  . Insulin Infusion Pump Supplies (MINIMED INFUSION SET-MMT 397) MISC 1 Device by Does not apply route every 3 (three) days.    . Insulin Infusion Pump Supplies (MINIMED RESERVOIR 3ML) MISC 1 Device by Does not apply route every 3 (three) days.    Marland Kitchen lisinopril (PRINIVIL,ZESTRIL) 2.5 MG tablet Take by mouth.    . loratadine (CLARITIN) 10 MG tablet Take by mouth.    . metoprolol tartrate (LOPRESSOR) 25 MG tablet Take by mouth 2 (two) times daily.     . Multiple Vitamin (MULTIVITAMIN) tablet Take 1 tablet by mouth daily.    Marland Kitchen warfarin (COUMADIN) 4 MG tablet Take by mouth.     No current facility-administered medications on file prior to visit.    Allergies  Allergen Reactions  . Dilaudid [Hydromorphone Hcl] Other (See Comments)    "flushing"  . Morphine Other (See Comments)    "flushing"    Family History  Problem Relation Age of Onset  . Lung cancer Mother   . Healthy Sister   . Healthy Sister   . Healthy Daughter     BP 100/60 (BP Location: Right Arm, Patient Position: Sitting, Cuff Size: Large)   Pulse 73   Ht 5\' 10"  (1.778 m)   Wt 233 lb 9.6 oz (106 kg)   SpO2 95%   BMI 33.52 kg/m    Review of Systems He denies hypoglycemia.     Objective:   Physical Exam VITAL SIGNS:  See vs page GENERAL: no distress Pulses: dorsalis pedis intact bilat.   MSK: no deformity of the feet CV: trace bilat leg edema, and bilat vv's Skin:  no ulcer on the feet, but there are heavy calluses.  normal color and temp on the feet. Neuro: sensation is intact to touch on the feet Ext: there is bilateral onychomycosis of the toenails.     Lab Results  Component Value Date   HGBA1C 7.3 (A) 01/02/2020   Lab Results  Component Value Date   CREATININE 1.02 10/25/2018   BUN 18 10/25/2018   NA 140 10/25/2018   K  4.5 10/25/2018   CL 105 10/25/2018   CO2 30 10/25/2018       Assessment & Plan:  Insulin-requiring type 2 DM, with CAD: this  is the best control this pt should aim for, given lack of mealtime boluses Hypoglycemia: resolved on reduced insulin.   Patient Instructions  check your blood sugar twice a day: before the 3 meals, and at bedtime.  also check if you have symptoms of your blood sugar being too high or too low.  please keep a record of the readings and bring it to your next appointment here (or you can bring the meter itself).  You can write it on any piece of paper.  please call us sooner if your blood sugar goes below 70, or if you have a lot of readings over 200.  Please take these pump settings:  basal rate of 2.1 units/HR, 7 AM-10 PM, and 0.9 units/HR 10PM-7AM.   No mealtime bolus.  correction bolus (which some people call "sensitivity," or "insulin sensitivity ratio," or just "isr") of 1 unit for each 25 by which your glucose exceeds 100.  For activity, suspend pump x 1-2 hrs. On this type of insulin pump schedule, you should eat meals on a regular schedule.  If a meal is missed or significantly delayed, your blood sugar could go low.  Best wishes with your new doctor in [place].  If you don't get one soon, please come back for a follow-up appointment here in 3 months.

## 2020-01-23 DIAGNOSIS — I39 Endocarditis and heart valve disorders in diseases classified elsewhere: Secondary | ICD-10-CM | POA: Diagnosis not present

## 2020-01-23 DIAGNOSIS — E785 Hyperlipidemia, unspecified: Secondary | ICD-10-CM | POA: Diagnosis not present

## 2020-01-23 DIAGNOSIS — I359 Nonrheumatic aortic valve disorder, unspecified: Secondary | ICD-10-CM | POA: Diagnosis not present

## 2020-01-23 DIAGNOSIS — I10 Essential (primary) hypertension: Secondary | ICD-10-CM | POA: Diagnosis not present

## 2020-01-23 DIAGNOSIS — Z125 Encounter for screening for malignant neoplasm of prostate: Secondary | ICD-10-CM | POA: Diagnosis not present

## 2020-01-23 DIAGNOSIS — E119 Type 2 diabetes mellitus without complications: Secondary | ICD-10-CM | POA: Diagnosis not present

## 2020-01-23 DIAGNOSIS — I2510 Atherosclerotic heart disease of native coronary artery without angina pectoris: Secondary | ICD-10-CM | POA: Diagnosis not present

## 2020-01-23 DIAGNOSIS — I872 Venous insufficiency (chronic) (peripheral): Secondary | ICD-10-CM | POA: Diagnosis not present

## 2020-02-05 ENCOUNTER — Encounter: Payer: Self-pay | Admitting: Neurology

## 2020-02-05 NOTE — Progress Notes (Signed)
Virtual Visit via Video Note The purpose of this virtual visit is to provide medical care while limiting exposure to the novel coronavirus.    Consent was obtained for video visit:  Yes.   Answered questions that patient had about telehealth interaction:  Yes.   I discussed the limitations, risks, security and privacy concerns of performing an evaluation and management service by telemedicine. I also discussed with the patient that there may be a patient responsible charge related to this service. The patient expressed understanding and agreed to proceed.  Pt location: Home Physician Location: home Name of referring provider:  [name], [name],* I connected with Percell Locus at patients initiation/request on 02/06/2020 at  9:45 AM EST by video enabled telemedicine application and verified that I am speaking with the correct person using two identifiers. Pt MRN:  [id number] Pt DOB:  03-03-1951 Video Participants:  Percell Locus;  Wife supplements hx   History of Present Illness:  Patient seen today in follow-up for Parkinson's disease.  My previous records as well as any outside records made available were reviewed prior to todays visit.  Pt denies falls.  Pt denies lightheadedness, near syncope.  No hallucinations.  Mood has been good.  Patient is on clonazepam for REM behavior disorder.  He is not having "crazy" dreams but having "lots of them" but they are vivid.  He is having frequent awakenings (not just for bathroom breaks).  He occasionally takes a nap in the day but not daily.  May snore some at night but wife doesn't note any stop breathing.  Talked to the patient last visit about making sure that he was seen dermatology.  Patient reports that has not done that.  Exercise consists of walking dog.  He has moved close to [employer].    Current movement d/o meds: Clonazepam 0.5 mg, half tablet at bedtime   Current Outpatient Medications on File Prior to Visit  Medication Sig  Dispense Refill  . APIDRA 100 UNIT/ML injection INJECT 60 UNITS SUBCUTANEOUSLY ONCE DAILY **FOR  USE  IN  INSULIN  PUMP** 60 mL 0  . aspirin EC 81 MG tablet Take 81 mg by mouth daily.     . Cholecalciferol (VITAMIN D3) 5000 units CAPS Take 5,000 Units by mouth every morning.     . clonazePAM (KLONOPIN) 0.5 MG tablet Take 0.5 tablets (0.25 mg total) by mouth at bedtime. 45 tablet 1  . famotidine (PEPCID) 10 MG tablet Take 10 mg by mouth every morning.     . gabapentin (NEURONTIN) 600 MG tablet Take 600 mg by mouth daily.     Marland Kitchen glucose blood (CONTOUR NEXT TEST) test strip 1 each by Other route 4 (four) times daily. Use to monitor glucose levels 4 times daily; E11.9 200 each 1  . Insulin Infusion Pump Supplies (MINIMED INFUSION SET-MMT 397) MISC 1 Device by Does not apply route every 3 (three) days.    . Insulin Infusion Pump Supplies (MINIMED RESERVOIR 3ML) MISC 1 Device by Does not apply route every 3 (three) days.    Marland Kitchen lisinopril (PRINIVIL,ZESTRIL) 2.5 MG tablet Take by mouth.    . loratadine (CLARITIN) 10 MG tablet Take by mouth.    . metoprolol tartrate (LOPRESSOR) 25 MG tablet Take by mouth 2 (two) times daily.     . Multiple Vitamin (MULTIVITAMIN) tablet Take 1 tablet by mouth daily.    Marland Kitchen warfarin (COUMADIN) 4 MG tablet Take by mouth.     No current facility-administered medications on file  prior to visit.     Observations/Objective:   There were no vitals filed for this visit. GEN:  The patient appears stated age and is in NAD.  Neurological examination:  Orientation: The patient is alert and oriented x3. Cranial nerves: There is good facial symmetry. There is minimal facial hypomimia.  The speech is fluent and clear. Soft palate rises symmetrically and there is no tongue deviation. Hearing is intact to conversational tone. Motor: Strength is at least antigravity x 4.   Shoulder shrug is equal and symmetric.  There is no pronator drift.  Movement examination: Tone:  unable Abnormal movements: none seen Coordination:  There is no decremation with RAM's, with any form of RAMS, including alternating supination and pronation of the forearm, hand opening and closing. Gait and Station: The patient is short stepped.      Assessment and Plan:   1.  Parkinsons Disease  -Currently on no medication and wants no medication today.  We discussed this and will continue to assess.  2.  Left foot drop  -Patient is status post surgical intervention for L5-S1 radiculopathy.  3.  Diabetic peripheral neuropathy  -Is the etiology for balance issues.  4.  Paraclinoid meningioma  -Patient is following with Dr. Vertell Limber.  Last saw Dr. Vertell Limber for this in September, 2020.  5.  RBD  -increase klonopin, 0.5 mg, 1 po q hs.  R/B/SE were discussed.  The opportunity to ask questions was given and they were answered to the best of my ability.  The patient expressed understanding and willingness to follow the outlined treatment protocols.  -Do think that some of the insomnia is coming from day/night reversal.  Talked about a regular daily schedule.  Discussed scheduling naps.  Discussed risks of not having a schedule, including increasing confusion/hallucinations as he gets older.  Wife is going to have him work on this.   Follow Up Instructions:  Patient really moved quite far away.  He is contemplating transferring care to [facility] clinic in [place]. I told him to go ahead and make an appointment with the neurologist there since it is much closer.  He may do that, but still wants to go ahead and follow-up here for now.  I will see him back in 6 months.  -I discussed the assessment and treatment plan with the patient. The patient was provided an opportunity to ask questions and all were answered. The patient agreed with the plan and demonstrated an understanding of the instructions.   The patient was advised to call back or seek an in-person evaluation if the symptoms worsen or if the  condition fails to improve as anticipated.    Total time spent on today's visit was 35 minutes, including both face-to-face time and nonface-to-face time.  Time included that spent on review of records (prior notes available to me/labs/imaging if pertinent), discussing treatment and goals, answering patient's questions and coordinating care.   Alonza Bogus, DO

## 2020-02-06 ENCOUNTER — Telehealth: Payer: Self-pay

## 2020-02-06 ENCOUNTER — Other Ambulatory Visit: Payer: Self-pay

## 2020-02-06 ENCOUNTER — Telehealth (INDEPENDENT_AMBULATORY_CARE_PROVIDER_SITE_OTHER): Payer: Medicare Other | Admitting: Neurology

## 2020-02-06 DIAGNOSIS — G4700 Insomnia, unspecified: Secondary | ICD-10-CM | POA: Diagnosis not present

## 2020-02-06 DIAGNOSIS — G20 Parkinson's disease: Secondary | ICD-10-CM

## 2020-02-06 DIAGNOSIS — G4752 REM sleep behavior disorder: Secondary | ICD-10-CM | POA: Diagnosis not present

## 2020-02-06 MED ORDER — CLONAZEPAM 0.5 MG PO TABS: 0.5000 mg | ORAL_TABLET | Freq: Every day | ORAL | 1 refills | Status: AC

## 2020-02-06 NOTE — Telephone Encounter (Signed)
Reached out to patient to ask them to log on per Dr Tat. Patients wife voiced understanding and stated they were logging on momentarily. Dr Tat notified.

## 2020-02-14 DIAGNOSIS — Z7901 Long term (current) use of anticoagulants: Secondary | ICD-10-CM | POA: Diagnosis not present

## 2020-02-14 DIAGNOSIS — Z952 Presence of prosthetic heart valve: Secondary | ICD-10-CM | POA: Diagnosis not present

## 2020-02-27 ENCOUNTER — Telehealth: Payer: Self-pay

## 2020-02-27 NOTE — Telephone Encounter (Signed)
LMN for pump supplies filled out and faxed to [facility] with confirmation.

## 2020-03-06 DIAGNOSIS — Z952 Presence of prosthetic heart valve: Secondary | ICD-10-CM | POA: Diagnosis not present

## 2020-03-06 DIAGNOSIS — Z7901 Long term (current) use of anticoagulants: Secondary | ICD-10-CM | POA: Diagnosis not present

## 2020-03-26 ENCOUNTER — Other Ambulatory Visit: Payer: Self-pay

## 2020-03-26 ENCOUNTER — Telehealth (INDEPENDENT_AMBULATORY_CARE_PROVIDER_SITE_OTHER): Payer: Medicare Other | Admitting: Endocrinology

## 2020-03-26 DIAGNOSIS — E1159 Type 2 diabetes mellitus with other circulatory complications: Secondary | ICD-10-CM

## 2020-03-26 DIAGNOSIS — Z794 Long term (current) use of insulin: Secondary | ICD-10-CM

## 2020-03-26 DIAGNOSIS — E0800 Diabetes mellitus due to underlying condition with hyperosmolarity without nonketotic hyperglycemic-hyperosmolar coma (NKHHC): Secondary | ICD-10-CM

## 2020-03-26 NOTE — Patient Instructions (Addendum)
check your blood sugar twice a day: before the 3 meals, and at bedtime.  also check if you have symptoms of your blood sugar being too high or too low.  please keep a record of the readings and bring it to your next appointment here (or you can bring the meter itself).  You can write it on any piece of paper.  please call us sooner if your blood sugar goes below 70, or if you have a lot of readings over 200.  Please take these pump settings:  basal rate of 2.1 units/HR, 7 AM-10 PM, and 0.9 units/HR 10PM-7AM.   No mealtime bolus.  correction bolus (which some people call "sensitivity," or "insulin sensitivity ratio," or just "isr") of 1 unit for each 25 by which your glucose exceeds 100.  For activity, suspend pump x 1-2 hrs. On this type of insulin pump schedule, you should eat meals on a regular schedule.  If a meal is missed or significantly delayed, your blood sugar could go low.  Please ask that the A1c result be sent here.   Best wishes with your new doctor in [place].  If you don't get one soon, please come back for a follow-up appointment here in 3 months.

## 2020-03-26 NOTE — Progress Notes (Signed)
Subjective:    Patient ID: Bradley Berger, male    DOB: 1951-12-09, 69 y.o.   MRN: [id number]  HPI  telehealth visit today via caregility video visit.  Alternatives to telehealth are presented to this patient, and the patient agrees to the telehealth visit. Pt is advised of the cost of the visit, and agrees to this, also.   Patient is at home, and I am at the office.   Persons attending the telehealth visit: the patient and I Pt returns for f/u of diabetes mellitus: DM type: Insulin-requiring type 2.  Dx'ed: 0000000 Complications: polyneuropathy and CAD.   Therapy: insulin since 1999 DKA: never Severe hypoglycemia: never Pancreatitis: never Other: he uses a medtronic paradigm pump; we stopped mealtime boluses, as pt was forgetting them; he declines continuous glucose monitor.   Interval history:  He takes these pump settings:   basal rate of 2.1 units/HR, 7 AM-10 PM, and 0.9 units/HR 10PM-7AM.   No mealtime bolus.  correction bolus (which some people call "sensitivity," or "insulin sensitivity ratio," or just "isr") of 1 unit for each 25 by which your glucose exceeds 100.  For activity, suspend pump x 1-2 hrs.  He says cbg varies from 104-250.  It is in general higher as the day goes on.  However, he has sxs of hypoglycemia with yard work during the day.  pt states he feels well in general.   Past Medical History:  Diagnosis Date  . Diabetes ([place]) 1997   Last A1C 6.2  . HTN (hypertension)   . Meningioma ([place])   . Parkinson disease ([place])   . S/P aortic valve replacement   . Sudden hearing loss    L ear    Past Surgical History:  Procedure Laterality Date  . BACK SURGERY    . CARDIAC SURGERY    . CATARACT EXTRACTION, BILATERAL    . HEMORROIDECTOMY    . KNEE SURGERY Left     Social History   Socioeconomic History  . Marital status: Married    Spouse name: Not on file  . Number of children: 3  . Years of education: Not on file  . Highest education level: Associate  degree: occupational, Hotel manager, or vocational program  Occupational History  . Not on file  Tobacco Use  . Smoking status: Never Smoker  . Smokeless tobacco: Never Used  Substance and Sexual Activity  . Alcohol use: Yes    Alcohol/week: 1.0 standard drinks    Types: 1 Cans of beer per week    Comment: very rare  . Drug use: No  . Sexual activity: Not on file  Other Topics Concern  . Not on file  Social History Narrative   Pt lives with his spouse Jana Half at home- 2 story home- he has 3 daughter   Right handed   Drinks tea, some soda, no coffee   Social Determinants of Radio broadcast assistant Strain:   . Difficulty of Paying Living Expenses:   Food Insecurity:   . Worried About Charity fundraiser in the Last Year:   . Arboriculturist in the Last Year:   Transportation Needs:   . Film/video editor (Medical):   Marland Kitchen Lack of Transportation (Non-Medical):   Physical Activity:   . Days of Exercise per Week:   . Minutes of Exercise per Session:   Stress:   . Feeling of Stress :   Social Connections:   . Frequency of Communication with Friends and Family:   .  Frequency of Social Gatherings with Friends and Family:   . Attends Religious Services:   . Active Member of Clubs or Organizations:   . Attends Archivist Meetings:   Marland Kitchen Marital Status:   Intimate Partner Violence:   . Fear of Current or Ex-Partner:   . Emotionally Abused:   Marland Kitchen Physically Abused:   . Sexually Abused:     Current Outpatient Medications on File Prior to Visit  Medication Sig Dispense Refill  . APIDRA 100 UNIT/ML injection INJECT 60 UNITS SUBCUTANEOUSLY ONCE DAILY **FOR  USE  IN  INSULIN  PUMP** (Patient taking differently: 40 Units. For use in pump) 60 mL 0  . aspirin EC 81 MG tablet Take 81 mg by mouth daily.     . Cholecalciferol (VITAMIN D3) 5000 units CAPS Take 5,000 Units by mouth every morning.     . clonazePAM (KLONOPIN) 0.5 MG tablet Take 1 tablet (0.5 mg total) by mouth at  bedtime. 90 tablet 1  . famotidine (PEPCID) 10 MG tablet Take 10 mg by mouth every morning.     . gabapentin (NEURONTIN) 600 MG tablet Take 600 mg by mouth daily.     Marland Kitchen glucose blood (CONTOUR NEXT TEST) test strip 1 each by Other route 4 (four) times daily. Use to monitor glucose levels 4 times daily; E11.9 (Patient taking differently: 1 each by Other route 2 (two) times daily. Use to monitor glucose levels 2 times daily; E11.9) 200 each 1  . Insulin Infusion Pump Supplies (MINIMED INFUSION SET-MMT 397) MISC 1 Device by Does not apply route every 3 (three) days.    . Insulin Infusion Pump Supplies (MINIMED RESERVOIR 3ML) MISC 1 Device by Does not apply route every 3 (three) days.    Marland Kitchen lisinopril (PRINIVIL,ZESTRIL) 2.5 MG tablet Take by mouth daily.     Marland Kitchen loratadine (CLARITIN) 10 MG tablet Take by mouth daily as needed.     . metoprolol tartrate (LOPRESSOR) 25 MG tablet Take by mouth 2 (two) times daily.     . Multiple Vitamin (MULTIVITAMIN) tablet Take 1 tablet by mouth daily.    Marland Kitchen warfarin (COUMADIN) 4 MG tablet Take by mouth. 4mg  M, W and F and 2mg  on other days     No current facility-administered medications on file prior to visit.    Allergies  Allergen Reactions  . Dilaudid [Hydromorphone Hcl] Other (See Comments)    "flushing"  . Morphine Other (See Comments)    "flushing"    Family History  Problem Relation Age of Onset  . Lung cancer Mother   . Healthy Sister   . Healthy Sister   . Healthy Daughter     There were no vitals taken for this visit.   Review of Systems He denies hypoglycemia.      Objective:   Physical Exam       Assessment & Plan:  Insulin-requiring type 2 DM, with CAD: uncertain glycemic control Hypoglycemia, due to insulin: this limits aggressiveness of glycemic control.   Patient Instructions  check your blood sugar twice a day: before the 3 meals, and at bedtime.  also check if you have symptoms of your blood sugar being too high or too low.   please keep a record of the readings and bring it to your next appointment here (or you can bring the meter itself).  You can write it on any piece of paper.  please call us sooner if your blood sugar goes below 70, or if you have  a lot of readings over 200.  Please take these pump settings:  basal rate of 2.1 units/HR, 7 AM-10 PM, and 0.9 units/HR 10PM-7AM.   No mealtime bolus.  correction bolus (which some people call "sensitivity," or "insulin sensitivity ratio," or just "isr") of 1 unit for each 25 by which your glucose exceeds 100.  For activity, suspend pump x 1-2 hrs. On this type of insulin pump schedule, you should eat meals on a regular schedule.  If a meal is missed or significantly delayed, your blood sugar could go low.  Please ask that the A1c result be sent here.   Best wishes with your new doctor in [place].  If you don't get one soon, please come back for a follow-up appointment here in 3 months.

## 2020-03-31 ENCOUNTER — Other Ambulatory Visit: Payer: Self-pay | Admitting: Endocrinology

## 2020-03-31 DIAGNOSIS — E0800 Diabetes mellitus due to underlying condition with hyperosmolarity without nonketotic hyperglycemic-hyperosmolar coma (NKHHC): Secondary | ICD-10-CM

## 2020-03-31 DIAGNOSIS — Z794 Long term (current) use of insulin: Secondary | ICD-10-CM

## 2020-03-31 NOTE — Telephone Encounter (Signed)
1.  Please schedule f/u appt 2.  Then please refill x 1, pending that appt.  

## 2020-03-31 NOTE — Telephone Encounter (Signed)
Please advise 

## 2020-04-01 ENCOUNTER — Telehealth: Payer: Self-pay | Admitting: Endocrinology

## 2020-04-01 DIAGNOSIS — E1165 Type 2 diabetes mellitus with hyperglycemia: Secondary | ICD-10-CM | POA: Diagnosis not present

## 2020-04-01 DIAGNOSIS — I10 Essential (primary) hypertension: Secondary | ICD-10-CM | POA: Diagnosis not present

## 2020-04-01 LAB — HEMOGLOBIN A1C: Hemoglobin A1C: 7.5

## 2020-04-01 NOTE — Telephone Encounter (Signed)
please contact patient: A1c=7.5% Please increase basal rate of 2.2 units/HR, 7 AM-10 PM, and 0.9 units/HR 10PM-7AM. I hope this helps.

## 2020-04-01 NOTE — Telephone Encounter (Signed)
LVM requesting returned call 

## 2020-04-02 NOTE — Telephone Encounter (Signed)
SECOND ATTEMPT: ° °LVM requesting returned call. °

## 2020-04-02 NOTE — Telephone Encounter (Signed)
FINAL ATTEMPT:  LVM requesting returned call. Given pump changes, letter has also been mailed to home address to aid in correcting A1C levels. For future reference, letter can be found in [place].

## 2020-04-03 DIAGNOSIS — Z7901 Long term (current) use of anticoagulants: Secondary | ICD-10-CM | POA: Diagnosis not present

## 2020-04-03 DIAGNOSIS — Z952 Presence of prosthetic heart valve: Secondary | ICD-10-CM | POA: Diagnosis not present

## 2020-04-04 MED ORDER — APIDRA 100 UNIT/ML IJ SOLN: 40.0000 [IU] | INTRAMUSCULAR | 0 refills | Status: DC

## 2020-04-04 NOTE — Addendum Note (Signed)
Addended by: Wyatt Haste F on: 04/04/2020 09:55 AM   Modules accepted: Orders

## 2020-04-14 ENCOUNTER — Other Ambulatory Visit: Payer: Self-pay

## 2020-04-14 ENCOUNTER — Telehealth: Payer: Self-pay | Admitting: Endocrinology

## 2020-04-14 DIAGNOSIS — E0800 Diabetes mellitus due to underlying condition with hyperosmolarity without nonketotic hyperglycemic-hyperosmolar coma (NKHHC): Secondary | ICD-10-CM

## 2020-04-14 DIAGNOSIS — Z794 Long term (current) use of insulin: Secondary | ICD-10-CM

## 2020-04-14 MED ORDER — APIDRA 100 UNIT/ML IJ SOLN: 40.0000 [IU] | INTRAMUSCULAR | 0 refills | Status: AC

## 2020-04-14 NOTE — Telephone Encounter (Signed)
Outpatient Medication Detail   Disp Refills Start End   insulin glulisine (APIDRA) 100 UNIT/ML injection 40 mL 0 04/14/2020    Sig - Route: Inject 0.4 mLs (40 Units total) into the skin See admin instructions. E11.9; For use in pump - Subcutaneous   Sent to pharmacy as: insulin glulisine (APIDRA) 100 UNIT/ML injection   E-Prescribing Status: Receipt confirmed by pharmacy (04/14/2020 11:05 AM EDT)

## 2020-04-14 NOTE — Telephone Encounter (Signed)
Per patient [facility] states they can not fill Apidra without a diagnosis code on the prescription.  Please correct so that patient can pick medicine up today if possible.

## 2020-05-01 DIAGNOSIS — Z952 Presence of prosthetic heart valve: Secondary | ICD-10-CM | POA: Diagnosis not present

## 2020-05-01 DIAGNOSIS — Z7901 Long term (current) use of anticoagulants: Secondary | ICD-10-CM | POA: Diagnosis not present

## 2020-06-18 DIAGNOSIS — E1165 Type 2 diabetes mellitus with hyperglycemia: Secondary | ICD-10-CM | POA: Diagnosis not present

## 2020-06-18 DIAGNOSIS — Z9641 Presence of insulin pump (external) (internal): Secondary | ICD-10-CM | POA: Diagnosis not present

## 2020-06-18 DIAGNOSIS — E782 Mixed hyperlipidemia: Secondary | ICD-10-CM | POA: Diagnosis not present

## 2020-06-18 DIAGNOSIS — I10 Essential (primary) hypertension: Secondary | ICD-10-CM | POA: Diagnosis not present

## 2020-06-18 DIAGNOSIS — Z794 Long term (current) use of insulin: Secondary | ICD-10-CM | POA: Diagnosis not present

## 2020-06-26 DIAGNOSIS — Z952 Presence of prosthetic heart valve: Secondary | ICD-10-CM | POA: Diagnosis not present

## 2020-06-26 DIAGNOSIS — Z7901 Long term (current) use of anticoagulants: Secondary | ICD-10-CM | POA: Diagnosis not present

## 2020-07-02 DIAGNOSIS — Z7901 Long term (current) use of anticoagulants: Secondary | ICD-10-CM | POA: Diagnosis not present

## 2020-07-02 DIAGNOSIS — E119 Type 2 diabetes mellitus without complications: Secondary | ICD-10-CM | POA: Diagnosis not present

## 2020-07-02 DIAGNOSIS — K644 Residual hemorrhoidal skin tags: Secondary | ICD-10-CM | POA: Diagnosis not present

## 2020-07-02 DIAGNOSIS — I10 Essential (primary) hypertension: Secondary | ICD-10-CM | POA: Diagnosis not present

## 2020-07-02 DIAGNOSIS — I25810 Atherosclerosis of coronary artery bypass graft(s) without angina pectoris: Secondary | ICD-10-CM | POA: Diagnosis not present

## 2020-07-02 DIAGNOSIS — G20 Parkinson's disease: Secondary | ICD-10-CM | POA: Diagnosis not present

## 2020-07-02 DIAGNOSIS — M19011 Primary osteoarthritis, right shoulder: Secondary | ICD-10-CM | POA: Diagnosis not present

## 2020-07-02 DIAGNOSIS — M19012 Primary osteoarthritis, left shoulder: Secondary | ICD-10-CM | POA: Diagnosis not present

## 2020-07-02 DIAGNOSIS — Z952 Presence of prosthetic heart valve: Secondary | ICD-10-CM | POA: Diagnosis not present

## 2020-07-18 DIAGNOSIS — Z954 Presence of other heart-valve replacement: Secondary | ICD-10-CM | POA: Diagnosis not present

## 2020-07-18 DIAGNOSIS — R002 Palpitations: Secondary | ICD-10-CM | POA: Diagnosis not present

## 2020-07-18 DIAGNOSIS — I10 Essential (primary) hypertension: Secondary | ICD-10-CM | POA: Diagnosis not present

## 2020-07-18 DIAGNOSIS — Z952 Presence of prosthetic heart valve: Secondary | ICD-10-CM | POA: Diagnosis not present

## 2020-07-22 ENCOUNTER — Telehealth: Payer: Self-pay

## 2020-07-22 NOTE — Telephone Encounter (Signed)
FAXED [place]: Medical records request Other records requested: Office notes  All above requested information has been faxed successfully to [facility] listed above. Documents and fax confirmation have been placed in the faxed file for future reference.

## 2020-07-24 DIAGNOSIS — K5909 Other constipation: Secondary | ICD-10-CM | POA: Diagnosis not present

## 2020-07-24 DIAGNOSIS — Z7901 Long term (current) use of anticoagulants: Secondary | ICD-10-CM | POA: Diagnosis not present

## 2020-07-24 DIAGNOSIS — K648 Other hemorrhoids: Secondary | ICD-10-CM | POA: Diagnosis not present

## 2020-07-24 DIAGNOSIS — Z952 Presence of prosthetic heart valve: Secondary | ICD-10-CM | POA: Diagnosis not present

## 2020-07-30 DIAGNOSIS — H40013 Open angle with borderline findings, low risk, bilateral: Secondary | ICD-10-CM | POA: Diagnosis not present

## 2020-07-30 DIAGNOSIS — G20 Parkinson's disease: Secondary | ICD-10-CM | POA: Diagnosis not present

## 2020-07-30 DIAGNOSIS — C719 Malignant neoplasm of brain, unspecified: Secondary | ICD-10-CM | POA: Diagnosis not present

## 2020-07-30 DIAGNOSIS — H1711 Central corneal opacity, right eye: Secondary | ICD-10-CM | POA: Diagnosis not present

## 2020-07-30 DIAGNOSIS — H43811 Vitreous degeneration, right eye: Secondary | ICD-10-CM | POA: Diagnosis not present

## 2020-07-30 DIAGNOSIS — H524 Presbyopia: Secondary | ICD-10-CM | POA: Diagnosis not present

## 2020-07-30 DIAGNOSIS — E119 Type 2 diabetes mellitus without complications: Secondary | ICD-10-CM | POA: Diagnosis not present

## 2020-07-30 DIAGNOSIS — Z961 Presence of intraocular lens: Secondary | ICD-10-CM | POA: Diagnosis not present

## 2020-07-31 DIAGNOSIS — Z9641 Presence of insulin pump (external) (internal): Secondary | ICD-10-CM | POA: Diagnosis not present

## 2020-07-31 DIAGNOSIS — E782 Mixed hyperlipidemia: Secondary | ICD-10-CM | POA: Diagnosis not present

## 2020-07-31 DIAGNOSIS — I10 Essential (primary) hypertension: Secondary | ICD-10-CM | POA: Diagnosis not present

## 2020-07-31 DIAGNOSIS — Z794 Long term (current) use of insulin: Secondary | ICD-10-CM | POA: Diagnosis not present

## 2020-07-31 DIAGNOSIS — E1165 Type 2 diabetes mellitus with hyperglycemia: Secondary | ICD-10-CM | POA: Diagnosis not present

## 2020-08-04 NOTE — Progress Notes (Deleted)
Assessment/Plan:   1.  Parkinsons Disease  -***On no medication, by his choice.  2.  Left foot drop  -Chronic.  Status post surgical intervention for L5-S1 radiculopathy  3.  Diabetic peripheral neuropathy  -Is etiology for balance issues.  4.  Paraclinoid meningioma  -Followed by Dr. Vertell Limber for this 5.  RBD  -On clonazepam, 0.5 mg, 1 tablet at bedtime.  Subjective:   Bradley Berger was seen today in follow up for Parkinsons disease.  My previous records were reviewed prior to todays visit as well as outside records available to me. Pt denies falls.  Pt denies lightheadedness, near syncope.  No hallucinations.  Mood has been good.  Current prescribed movement disorder medications: ***Clonazepam, 0.5 mg, 1 tablet at bedtime (increased last visit)   PREVIOUS MEDICATIONS: {Parkinson's RX:18200}  ALLERGIES:   Allergies  Allergen Reactions  . Dilaudid [Hydromorphone Hcl] Other (See Comments)    "flushing"  . Morphine Other (See Comments)    "flushing"    CURRENT MEDICATIONS:  Outpatient Encounter Medications as of 08/05/2020  Medication Sig  . aspirin EC 81 MG tablet Take 81 mg by mouth daily.   . Cholecalciferol (VITAMIN D3) 5000 units CAPS Take 5,000 Units by mouth every morning.   . clonazePAM (KLONOPIN) 0.5 MG tablet Take 1 tablet (0.5 mg total) by mouth at bedtime.  . famotidine (PEPCID) 10 MG tablet Take 10 mg by mouth every morning.   . gabapentin (NEURONTIN) 600 MG tablet Take 600 mg by mouth daily.   Marland Kitchen glucose blood (CONTOUR NEXT TEST) test strip 1 each by Other route 4 (four) times daily. Use to monitor glucose levels 4 times daily; E11.9 (Patient taking differently: 1 each by Other route 2 (two) times daily. Use to monitor glucose levels 2 times daily; E11.9)  . insulin glulisine (APIDRA) 100 UNIT/ML injection Inject 0.4 mLs (40 Units total) into the skin See admin instructions. E11.9; For use in pump  . Insulin Infusion Pump Supplies (MINIMED INFUSION SET-MMT  397) MISC 1 Device by Does not apply route every 3 (three) days.  . Insulin Infusion Pump Supplies (MINIMED RESERVOIR 3ML) MISC 1 Device by Does not apply route every 3 (three) days.  Marland Kitchen lisinopril (PRINIVIL,ZESTRIL) 2.5 MG tablet Take by mouth daily.   Marland Kitchen loratadine (CLARITIN) 10 MG tablet Take by mouth daily as needed.   . metoprolol tartrate (LOPRESSOR) 25 MG tablet Take by mouth 2 (two) times daily.   . Multiple Vitamin (MULTIVITAMIN) tablet Take 1 tablet by mouth daily.  Marland Kitchen warfarin (COUMADIN) 4 MG tablet Take by mouth. 4mg  M, W and F and 2mg  on other days  . [DISCONTINUED] insulin glulisine (APIDRA) 100 UNIT/ML injection Inject 0.4 mLs (40 Units total) into the skin See admin instructions. For use in pump   No facility-administered encounter medications on file as of 08/05/2020.    Objective:   PHYSICAL EXAMINATION:    VITALS:  There were no vitals filed for this visit.  GEN:  The patient appears stated age and is in NAD. HEENT:  Normocephalic, atraumatic.  The mucous membranes are moist. The superficial temporal arteries are without ropiness or tenderness. CV:  RRR Lungs:  CTAB Neck/HEME:  There are no carotid bruits bilaterally.  Neurological examination:  Orientation: The patient is alert and oriented x3. Cranial nerves: There is good facial symmetry with*** facial hypomimia. The speech is fluent and clear. Soft palate rises symmetrically and there is no tongue deviation. Hearing is intact to conversational tone. Sensation: Sensation  is intact to light touch throughout Motor: Strength is at least antigravity x4.  Movement examination: Tone: There is ***tone in the *** Abnormal movements: *** Coordination:  There is *** decremation with RAM's, *** Gait and Station: The patient has *** difficulty arising out of a deep-seated chair without the use of the hands. The patient's stride length is ***.  The patient has a *** pull test.     I have reviewed and interpreted the  following labs independently   He had lab work on July 02, 2020.  His hemoglobin A1c was 6.8.  On April 01, 2020, his sodium was 138, potassium 4.7, chloride 102, CO2 27, BUN 18, creatinine 0.87  Total time spent on today's visit was ***30 minutes, including both face-to-face time and nonface-to-face time.  Time included that spent on review of records (prior notes available to me/labs/imaging if pertinent), discussing treatment and goals, answering patient's questions and coordinating care.  Cc:  [name], [name], MD

## 2020-08-05 ENCOUNTER — Other Ambulatory Visit: Payer: Self-pay | Admitting: Neurology

## 2020-08-05 ENCOUNTER — Ambulatory Visit: Payer: Medicare Other | Admitting: Neurology

## 2020-08-08 ENCOUNTER — Other Ambulatory Visit: Payer: Self-pay | Admitting: Neurology

## 2020-08-08 NOTE — Telephone Encounter (Signed)
Patient's wife called and requested refills on clonazepam .5 MG to last until he meets with his new neurologist on 09/30/20 in [place].  [place], [place]

## 2020-08-08 NOTE — Telephone Encounter (Signed)
Spoke with patients wife and informed her that an appt needs to be made in order for the klonopin to be refilled because the medication is controlled. Offered virtual visit and patient canceled appt scheduled for last week. Patient talking in the background and declined appt. Patient stated he will be ok without the medication.   Dr Tat made aware.

## 2020-08-08 NOTE — Telephone Encounter (Signed)
Can he do VV with [place]?  He cancelled appt last week.  Was unaware he moved but this is controlled substance.

## 2020-08-21 DIAGNOSIS — Z7901 Long term (current) use of anticoagulants: Secondary | ICD-10-CM | POA: Diagnosis not present

## 2020-08-21 DIAGNOSIS — Z952 Presence of prosthetic heart valve: Secondary | ICD-10-CM | POA: Diagnosis not present

## 2020-08-28 ENCOUNTER — Other Ambulatory Visit: Payer: Self-pay | Admitting: Endocrinology

## 2020-08-28 DIAGNOSIS — E0800 Diabetes mellitus due to underlying condition with hyperosmolarity without nonketotic hyperglycemic-hyperosmolar coma (NKHHC): Secondary | ICD-10-CM

## 2020-09-08 DIAGNOSIS — Z23 Encounter for immunization: Secondary | ICD-10-CM | POA: Diagnosis not present

## 2020-09-16 DIAGNOSIS — H9313 Tinnitus, bilateral: Secondary | ICD-10-CM | POA: Diagnosis not present

## 2020-09-16 DIAGNOSIS — H905 Unspecified sensorineural hearing loss: Secondary | ICD-10-CM | POA: Diagnosis not present

## 2020-09-18 DIAGNOSIS — Z7901 Long term (current) use of anticoagulants: Secondary | ICD-10-CM | POA: Diagnosis not present

## 2020-09-18 DIAGNOSIS — Z952 Presence of prosthetic heart valve: Secondary | ICD-10-CM | POA: Diagnosis not present

## 2020-09-22 DIAGNOSIS — I10 Essential (primary) hypertension: Secondary | ICD-10-CM | POA: Diagnosis not present

## 2020-09-30 DIAGNOSIS — G20 Parkinson's disease: Secondary | ICD-10-CM | POA: Diagnosis not present

## 2020-10-08 DIAGNOSIS — D329 Benign neoplasm of meninges, unspecified: Secondary | ICD-10-CM | POA: Diagnosis not present

## 2020-10-08 DIAGNOSIS — Z6834 Body mass index (BMI) 34.0-34.9, adult: Secondary | ICD-10-CM | POA: Diagnosis not present

## 2020-10-09 DIAGNOSIS — E1165 Type 2 diabetes mellitus with hyperglycemia: Secondary | ICD-10-CM | POA: Diagnosis not present

## 2020-10-09 DIAGNOSIS — E782 Mixed hyperlipidemia: Secondary | ICD-10-CM | POA: Diagnosis not present

## 2020-10-09 DIAGNOSIS — Z794 Long term (current) use of insulin: Secondary | ICD-10-CM | POA: Diagnosis not present

## 2020-10-16 DIAGNOSIS — Z7901 Long term (current) use of anticoagulants: Secondary | ICD-10-CM | POA: Diagnosis not present

## 2020-10-16 DIAGNOSIS — Z952 Presence of prosthetic heart valve: Secondary | ICD-10-CM | POA: Diagnosis not present

## 2020-11-16 DIAGNOSIS — Z7901 Long term (current) use of anticoagulants: Secondary | ICD-10-CM | POA: Diagnosis not present

## 2020-11-16 DIAGNOSIS — Z952 Presence of prosthetic heart valve: Secondary | ICD-10-CM | POA: Diagnosis not present

## 2020-11-18 DIAGNOSIS — Z794 Long term (current) use of insulin: Secondary | ICD-10-CM | POA: Diagnosis not present

## 2020-11-18 DIAGNOSIS — B351 Tinea unguium: Secondary | ICD-10-CM | POA: Diagnosis not present

## 2020-11-18 DIAGNOSIS — L600 Ingrowing nail: Secondary | ICD-10-CM | POA: Diagnosis not present

## 2020-11-18 DIAGNOSIS — E1165 Type 2 diabetes mellitus with hyperglycemia: Secondary | ICD-10-CM | POA: Diagnosis not present

## 2020-11-23 DIAGNOSIS — E1165 Type 2 diabetes mellitus with hyperglycemia: Secondary | ICD-10-CM | POA: Diagnosis not present

## 2020-11-23 DIAGNOSIS — Z794 Long term (current) use of insulin: Secondary | ICD-10-CM | POA: Diagnosis not present

## 2020-11-23 DIAGNOSIS — B351 Tinea unguium: Secondary | ICD-10-CM | POA: Diagnosis not present

## 2020-11-23 DIAGNOSIS — L600 Ingrowing nail: Secondary | ICD-10-CM | POA: Diagnosis not present
